# Patient Record
Sex: Female | Born: 1965 | Race: White | Hispanic: No | Marital: Married
Health system: Midwestern US, Academic
[De-identification: ages and names within clinical notes are randomized; demographics above are authoritative.]

---

## 2017-06-04 IMAGING — MG TOMO IMPLANT
11 of 17 series · 11 of 17 positions shown · non-contrast
Comparison: none

[R CC (1 of 2)]
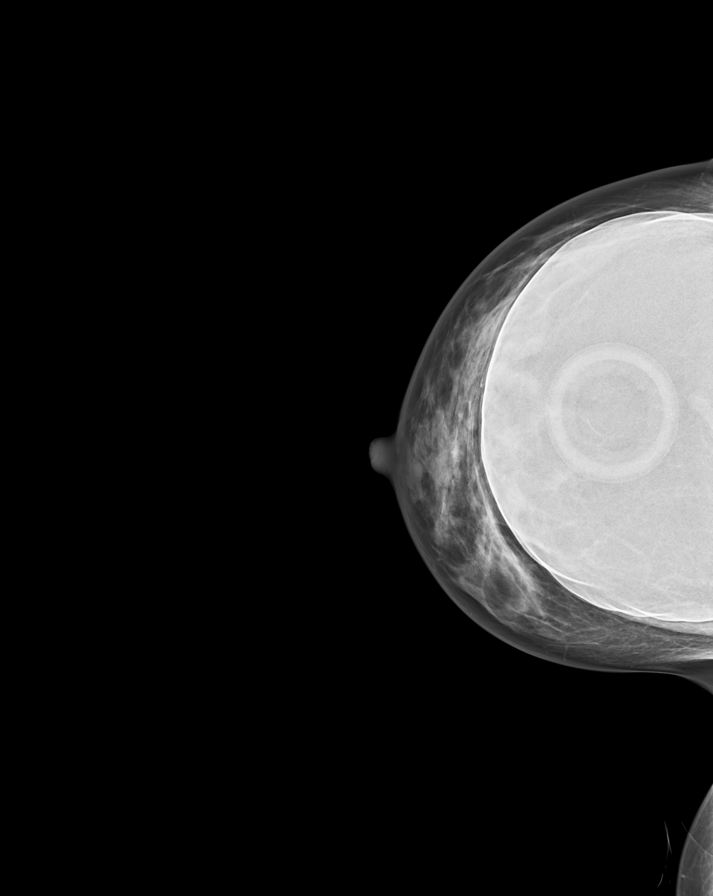

[L MLO (1 of 2)]
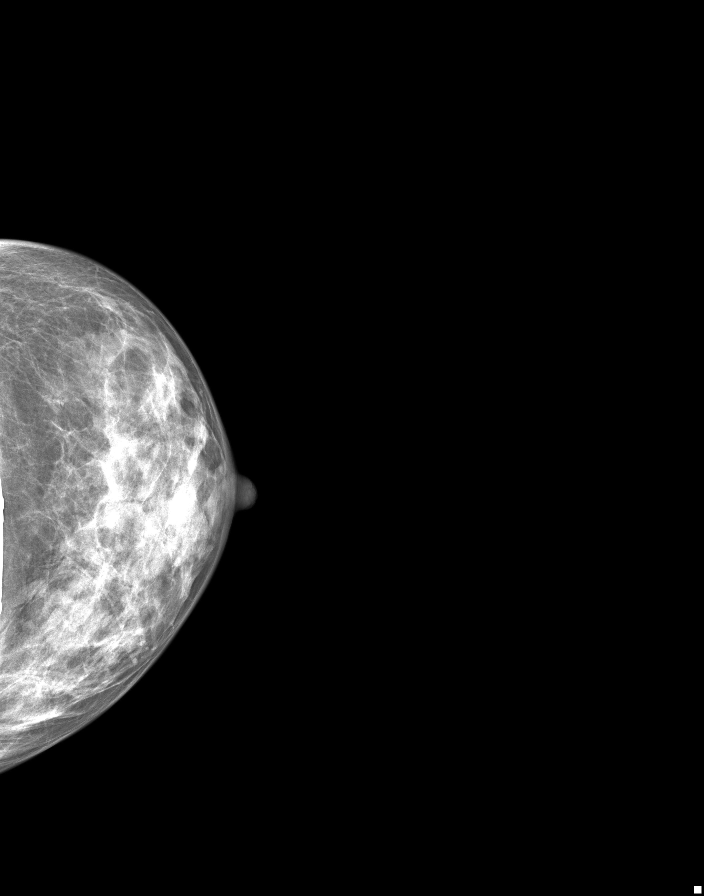

[L tomo]
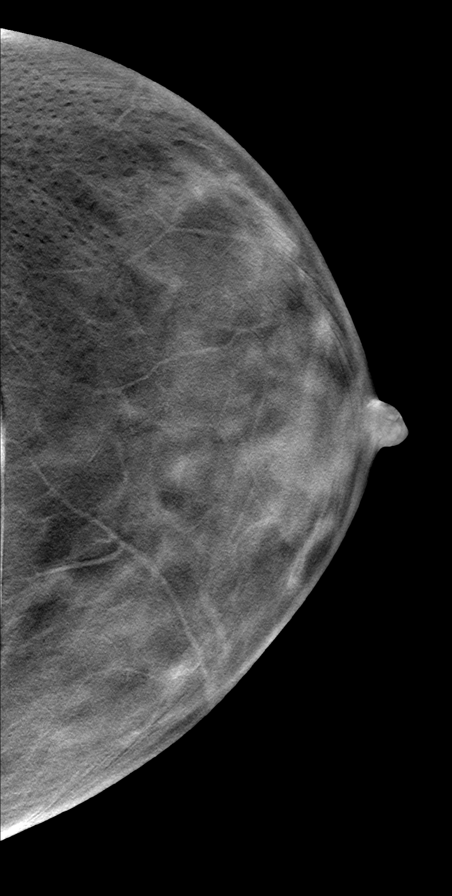

[L MLO (2 of 2)]
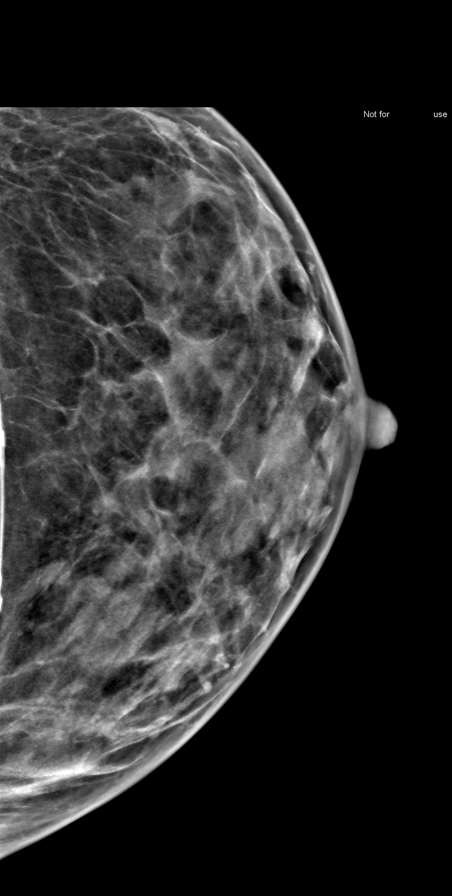

[L]
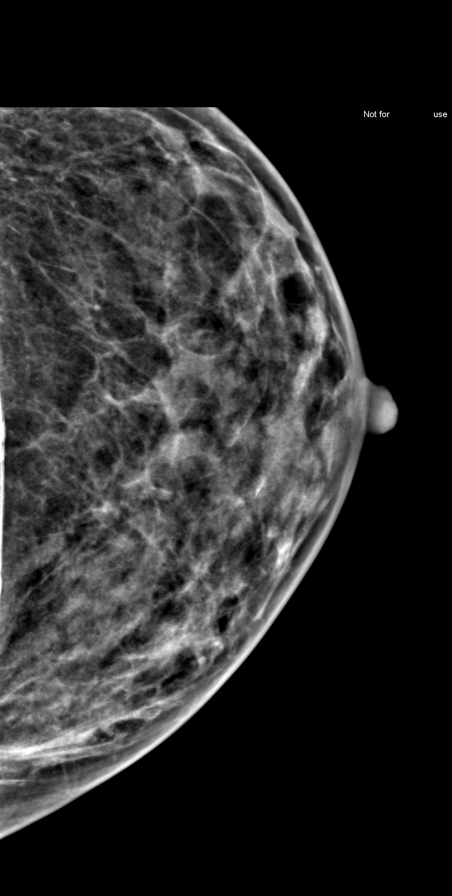

[R MLO (1 of 2)]
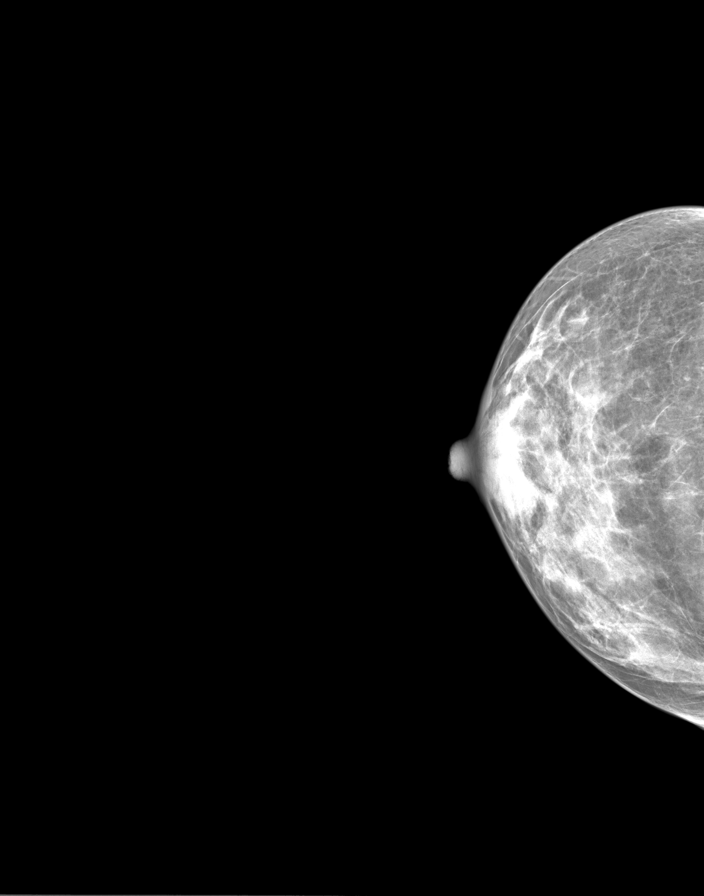

[R tomo (1 of 2)]
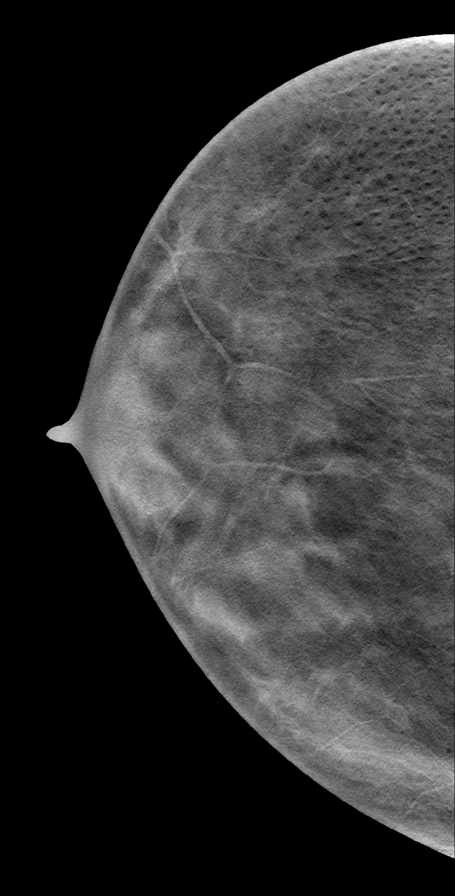

[R MLO (2 of 2)]
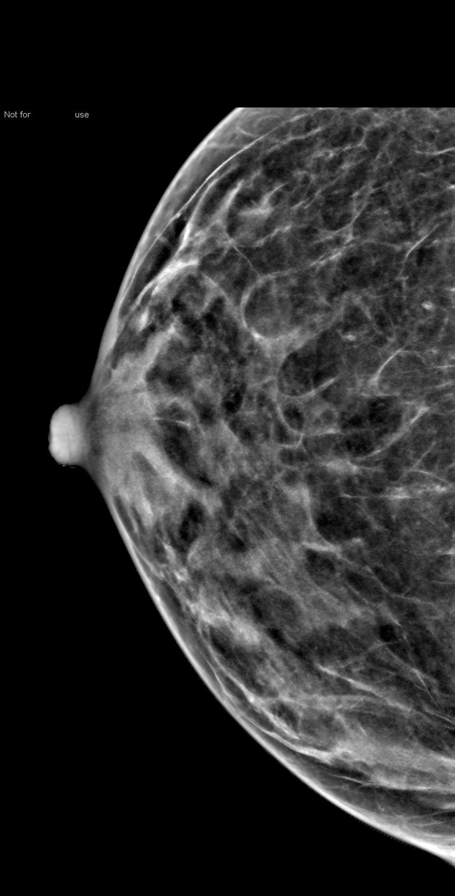

[R]
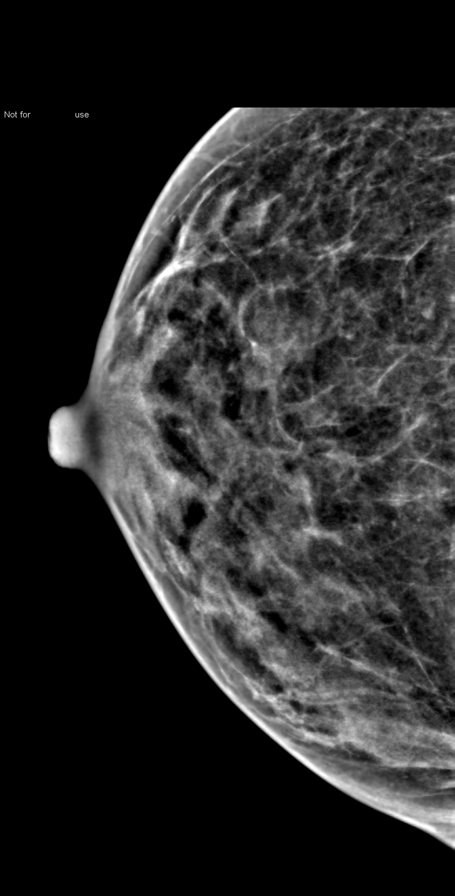

[R CC (2 of 2)]
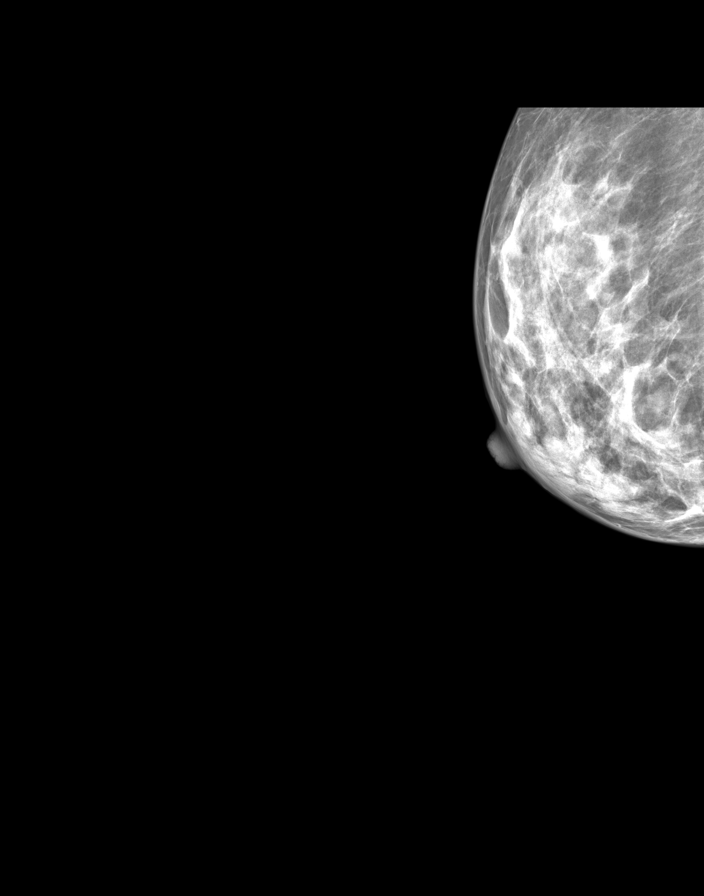

[R tomo (2 of 2)]
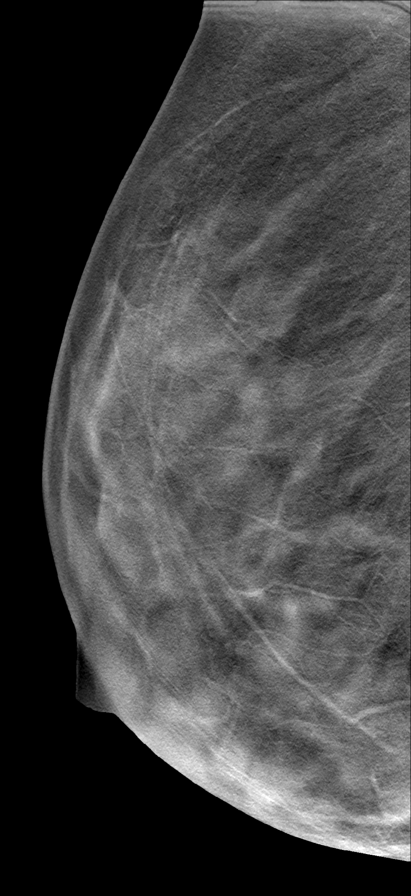

[11 of 17 positions shown; findings below may reference images not displayed]

EXAM

BILATERAL DIGITAL SCREENING MAMMOGRAM, CPT [id number]; WITH CAD

INDICATION

SCREENING
SCREENING. 3D IMPLANTS. LM

TECHNIQUE

Digital 2D CC and MLO projections obtained with 3D tomographic views per manufacturer's protocol.
ICAD version 7.2 was used during this exam.

COMPARISONS

Previous examinations dated 04/12/2016 and 03/24/2014.

FINDINGS

Bilateral retropectoral saline implants are identified in place. The breast parenchyma is
heterogeneously dense. There are no suspicious micro calcifications or suspicious spiculated
masses. There is no evidence of axillary adenopathy. There is no evidence of skin thickening or
nipple retraction.

IMPRESSION

No localizing signs of malignancy. Bilateral retropectoral saline implants are identified.

BI-RADS 2, BENIGN.

A reminder letter will be sent.

## 2017-11-18 IMAGING — CR UP_EXM
3 series · 3 of 3 positions shown · non-contrast
Comparison: none

[wrist pa]
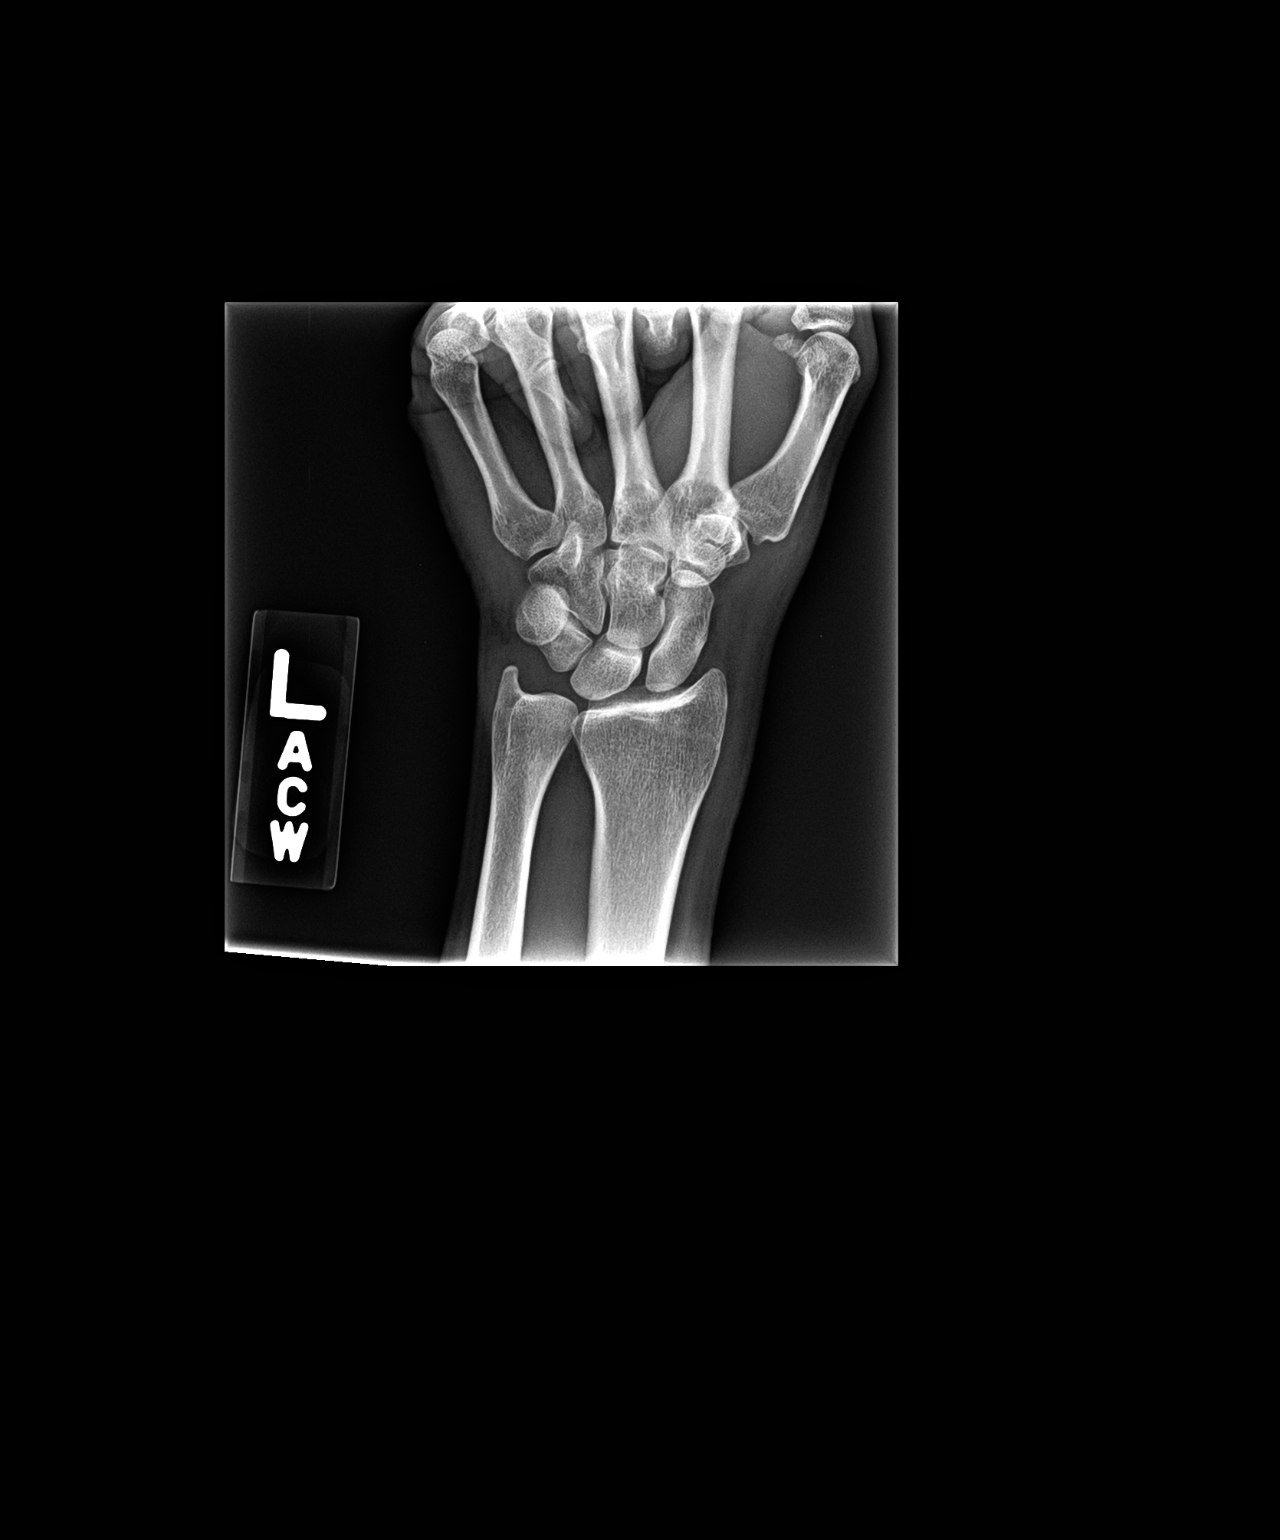

[wrist obl]
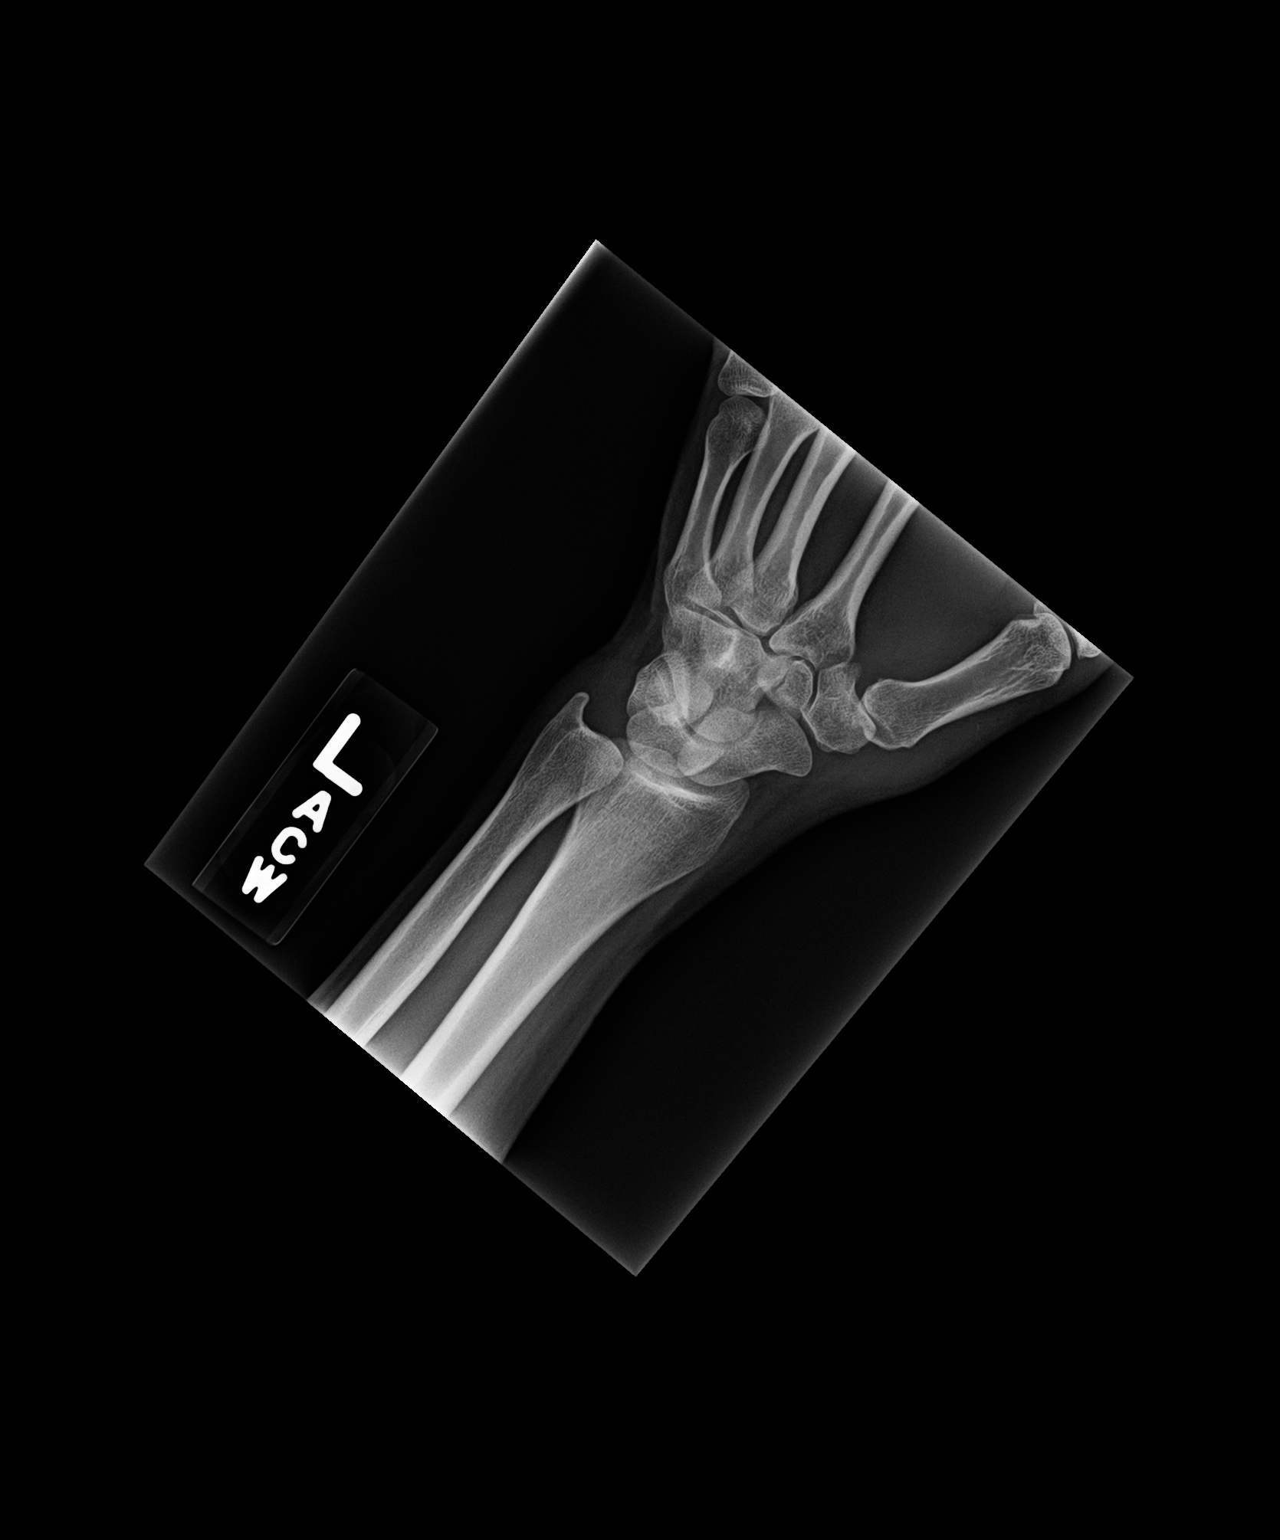

[wrist lat]
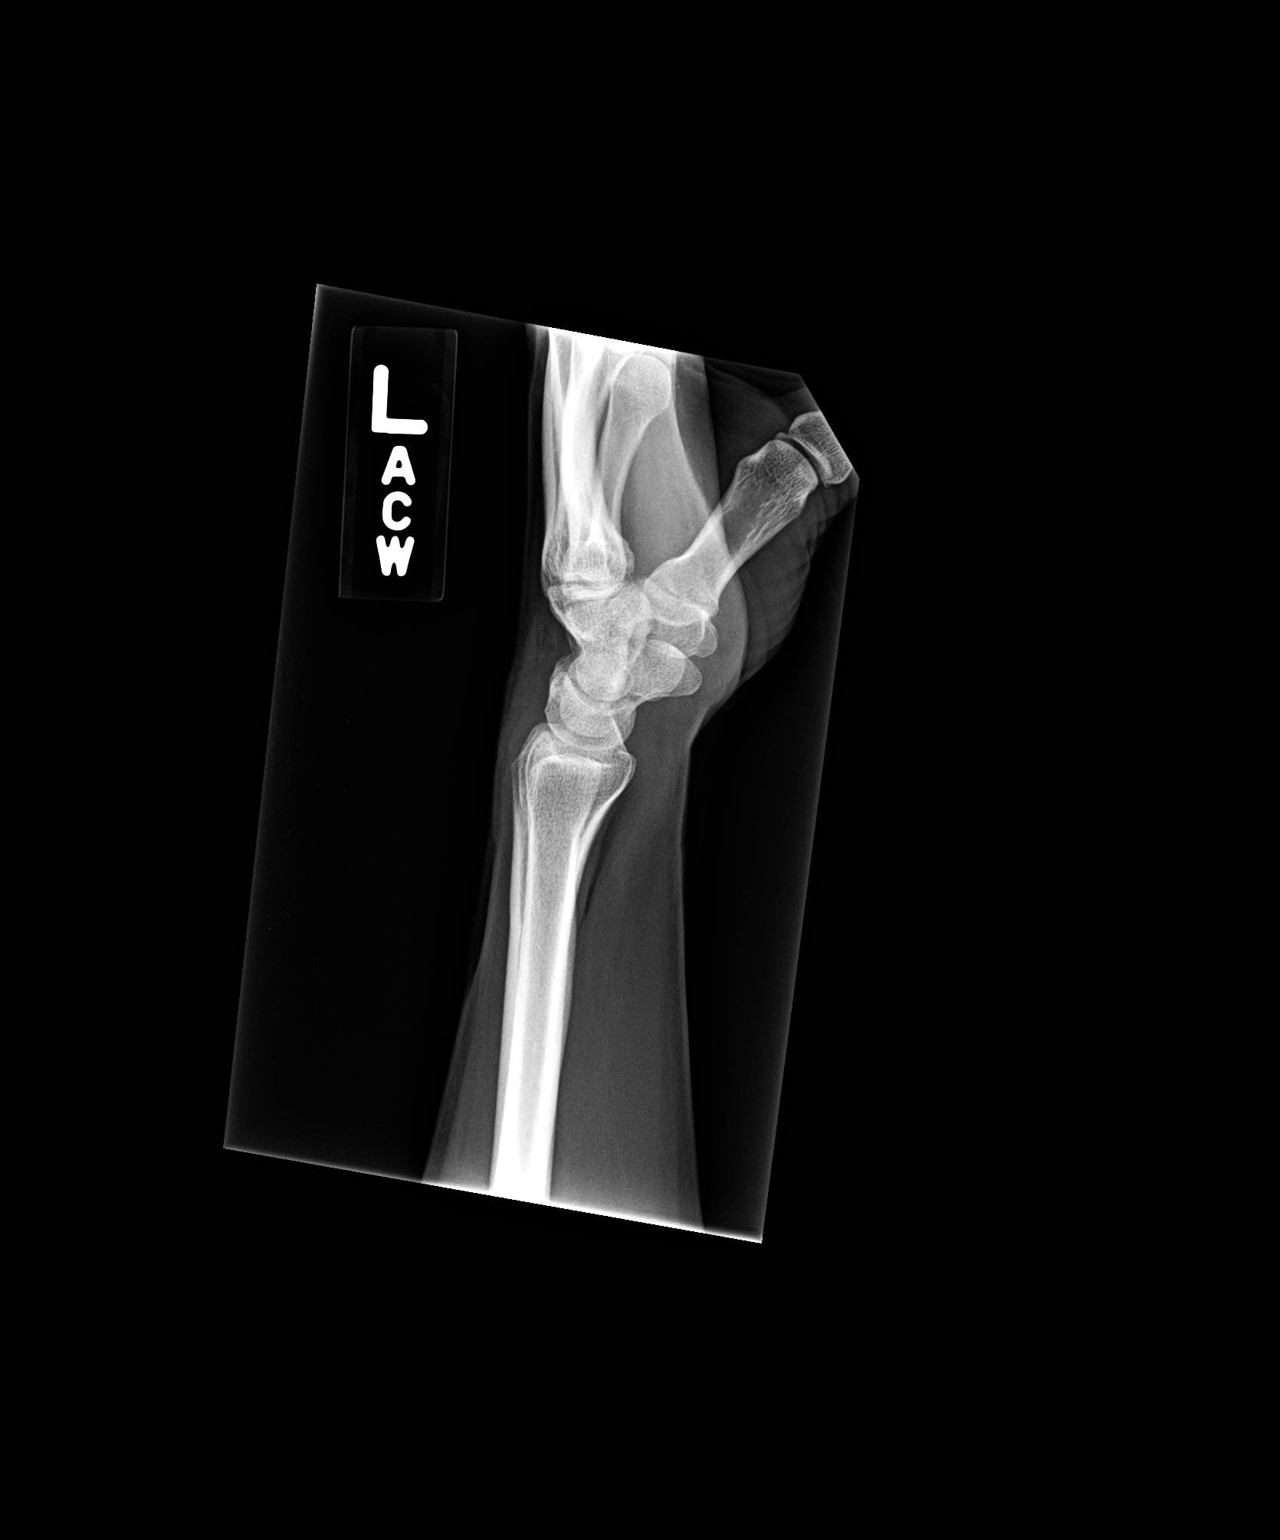

[3 of 3 positions shown; findings below may reference images not displayed]

DIAGNOSTIC STUDIES

EXAM
Left wrist radiographs.

INDICATION
Left wrist pain
Pt fell x 1 week. Pain in Lt 1st digit metacarpal. Denies pregnancy. AW

TECHNIQUE
PA, oblique, and lateral views of the left wrist.

COMPARISONS
None.

FINDINGS
The alignment is anatomic without displaced fracture or dislocation. Joint spaces are intact. No
aggressive osseous lesion.

IMPRESSION
Negative left wrist series.

Tech Notes:

Pt fell x 1 week. Pain in Lt 1st digit metacarpal. Denies pregnancy. AW

## 2020-03-16 IMAGING — MG TOMO IMPLANT
12 of 17 series · 12 of 17 positions shown · non-contrast
Comparison: none

[R CC (1 of 3)]
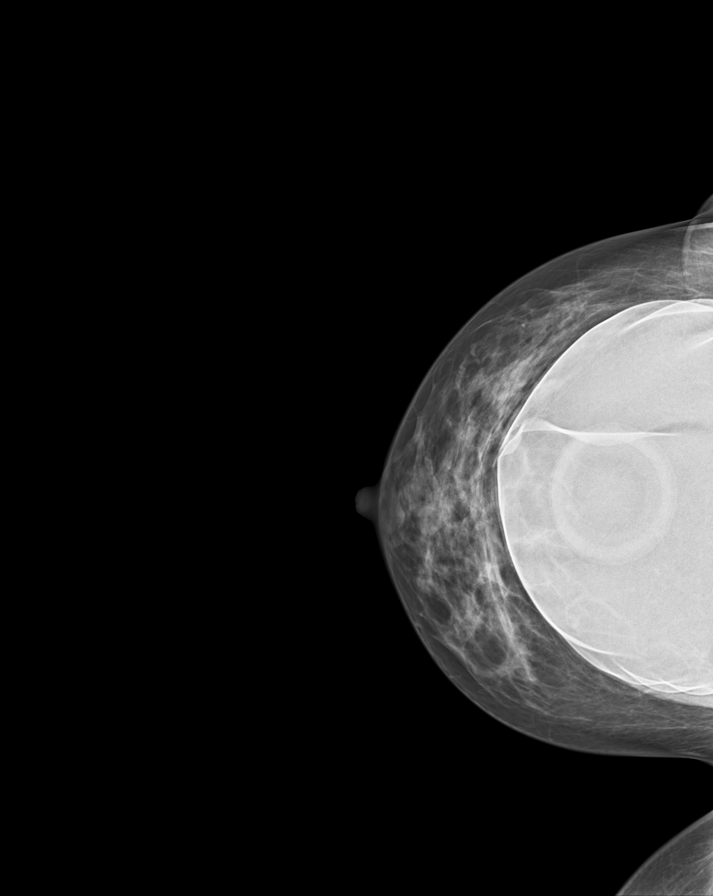

[L MLO (1 of 2)]
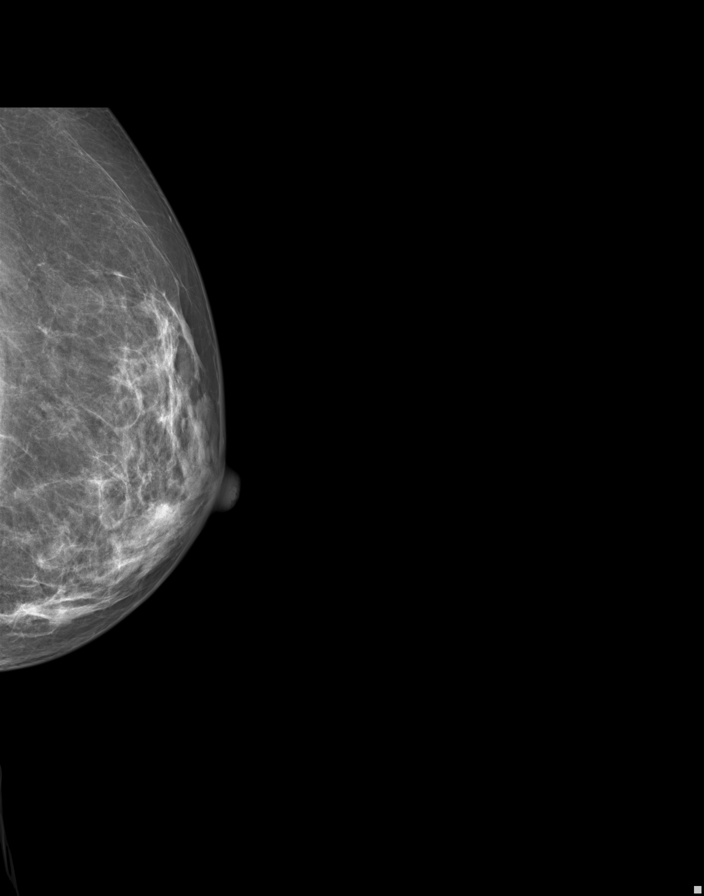

[L tomo]
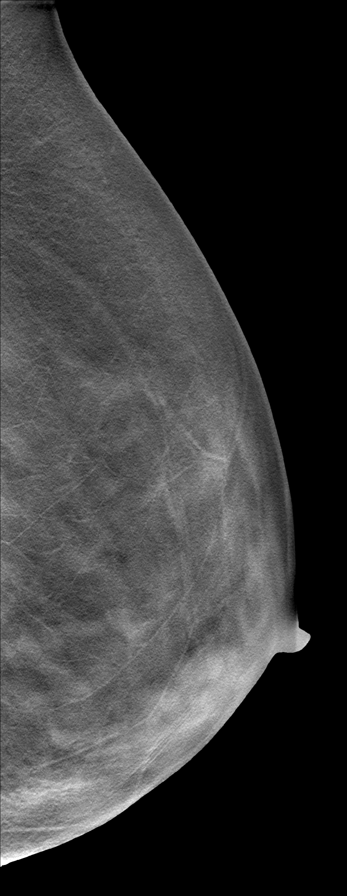

[L MLO (2 of 2)]
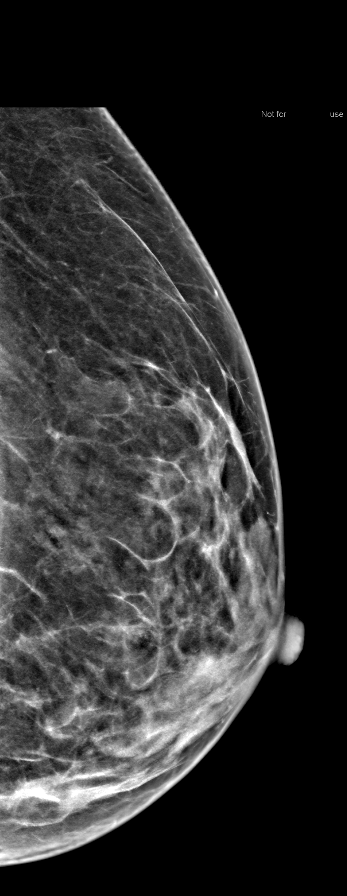

[L]
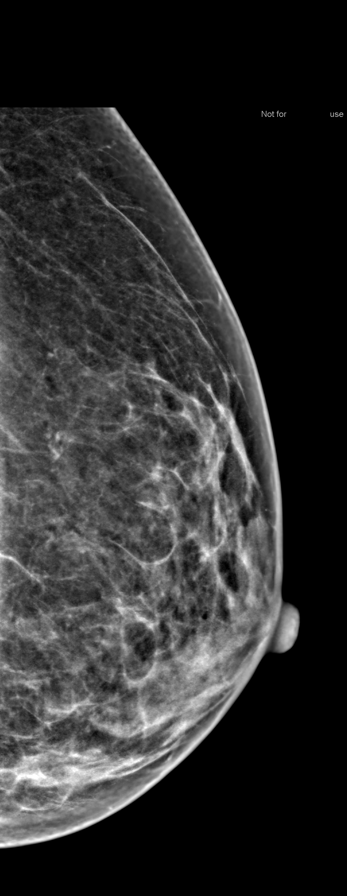

[R MLO (1 of 2)]
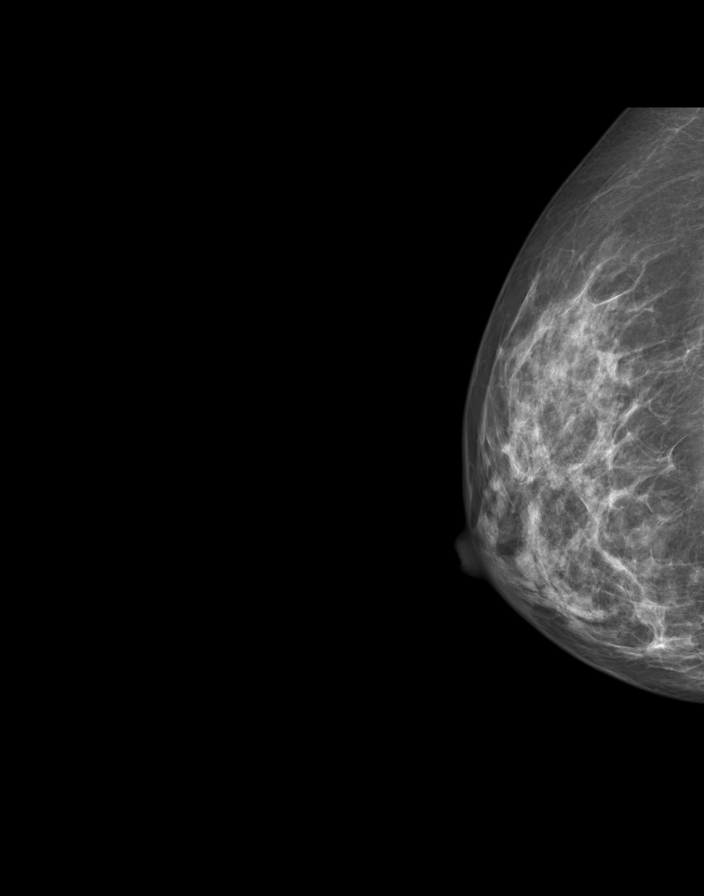

[R tomo (1 of 2)]
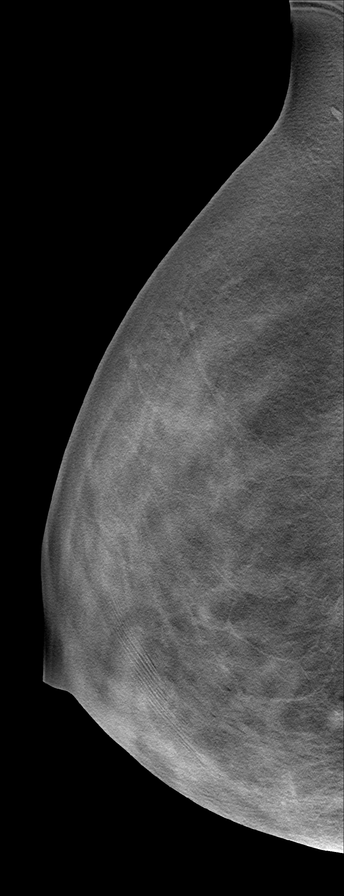

[R MLO (2 of 2)]
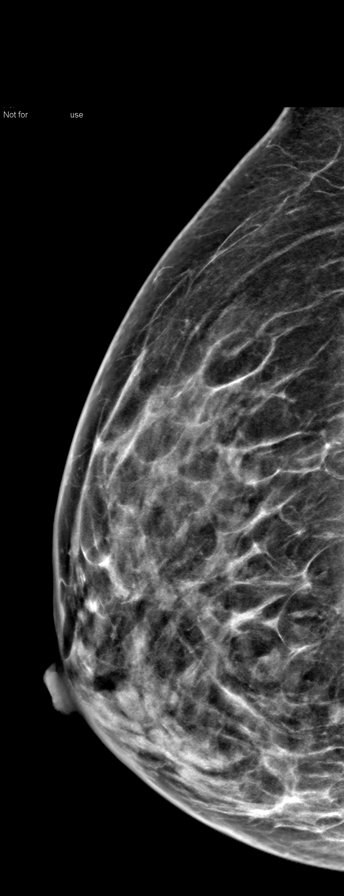

[R]
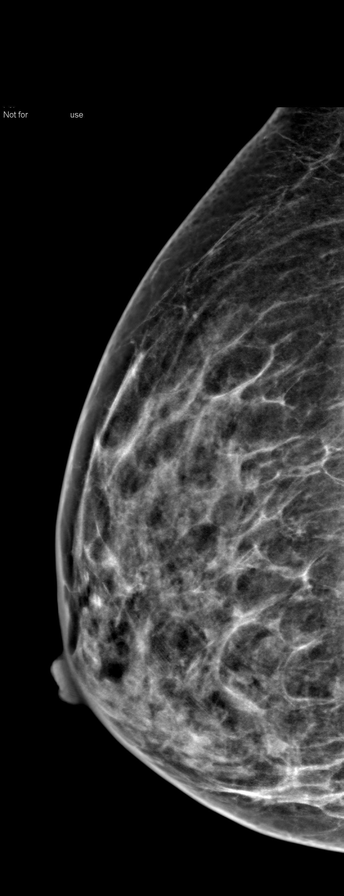

[R CC (2 of 3)]
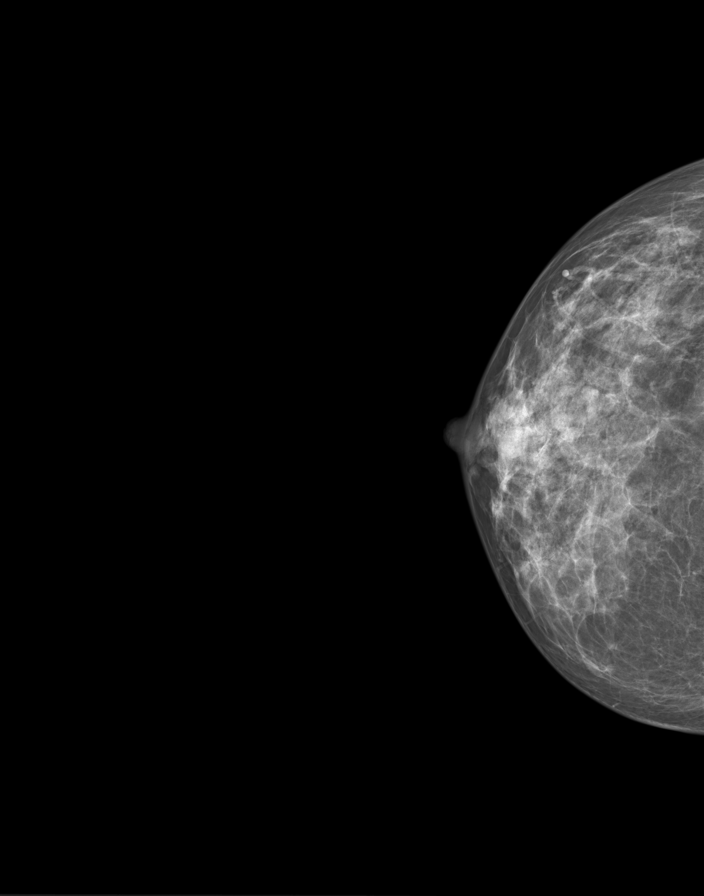

[R tomo (2 of 2)]
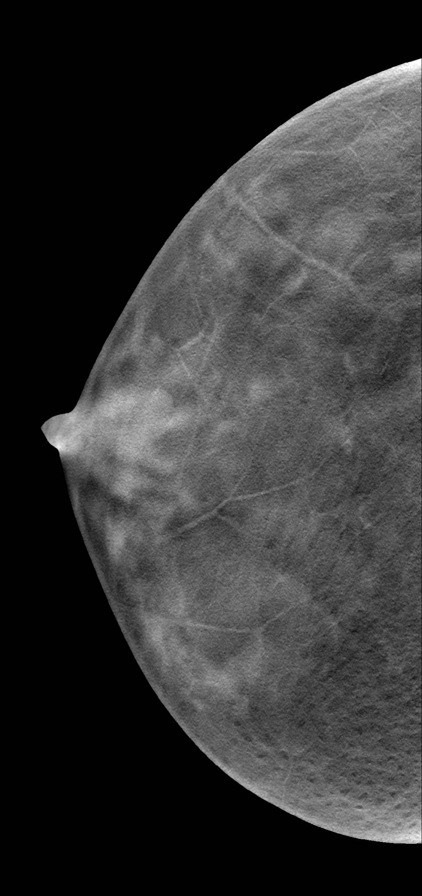

[R CC (3 of 3)]
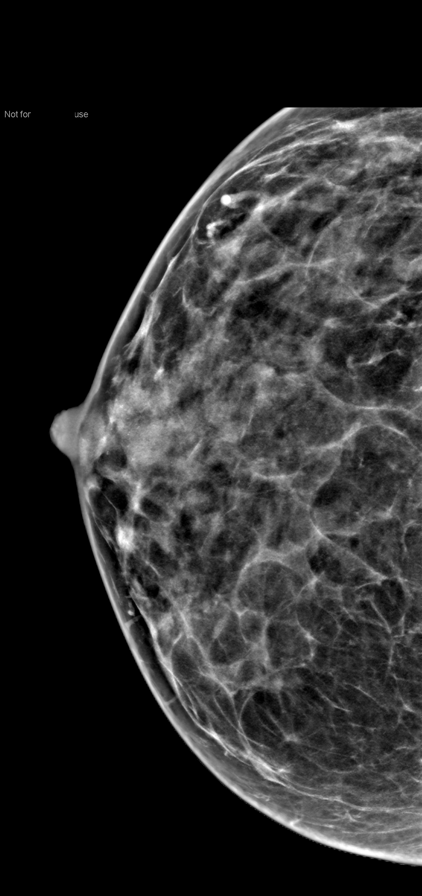

[12 of 17 positions shown; findings below may reference images not displayed]

EXAM

SCREENING MAMMOGRAM, BILATERAL

INDICATION

yearly screeening
0119 SALINE IMPLANTS, BEHIND THE MUSCLE.  BIOIDENTICAL HRT X 1 YR.  SCREENING.  AB (3D) PRIORS:

TECHNIQUE

Bilateral craniocaudal and medial lateral oblique views were generated and reviewed with computer-
aided detection.

COMPARISONS

July 08, 2018,June 04, 2017

FINDINGS

Heterogeneous breast parenchyma which can obscure small masses. Bilateral retropectoral saline
implants. Circumscribed mass in the right breast posterior depth approximately 7.5 cm deep to the
nipple, corresponding to the [DATE] position based off tomosynthesis. This is not definitively seen on
prior studies, though likely represents a cyst.

IMPRESSION

Recommend ultrasound of the right breast.

BI-RADS 0- Incomplete: Additional imaging evaluation needed.

Tech Notes:

## 2020-08-02 IMAGING — CR WRISTCMRT
3 series · 3 of 3 positions shown · non-contrast
Comparison: none

[wrist pa]
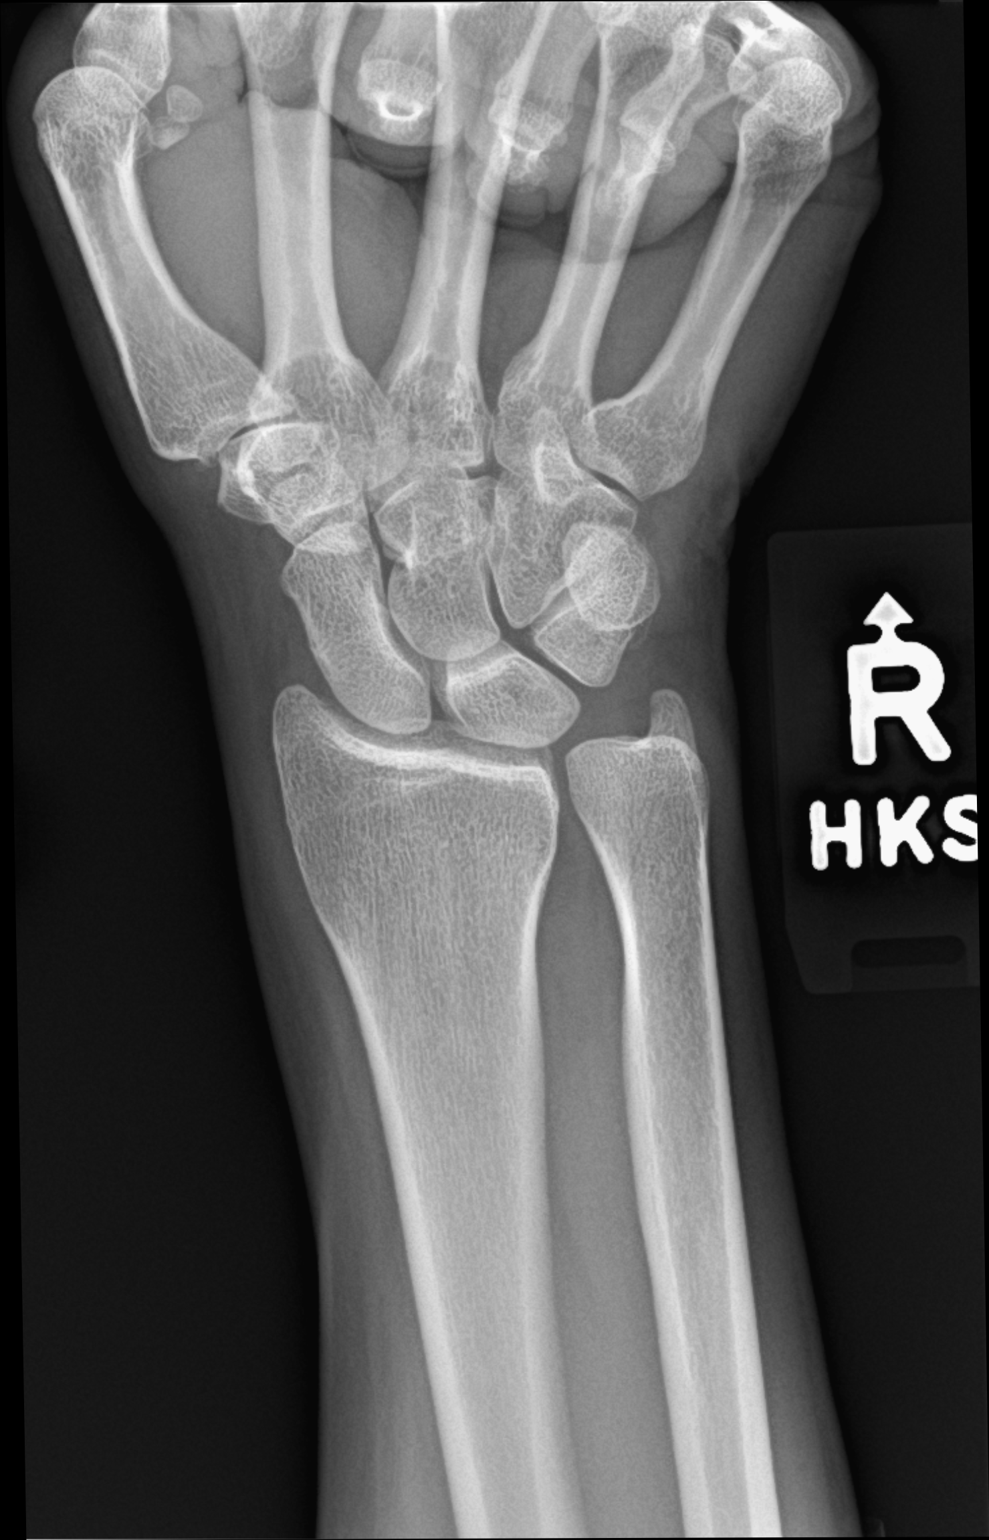

[wrist obl]
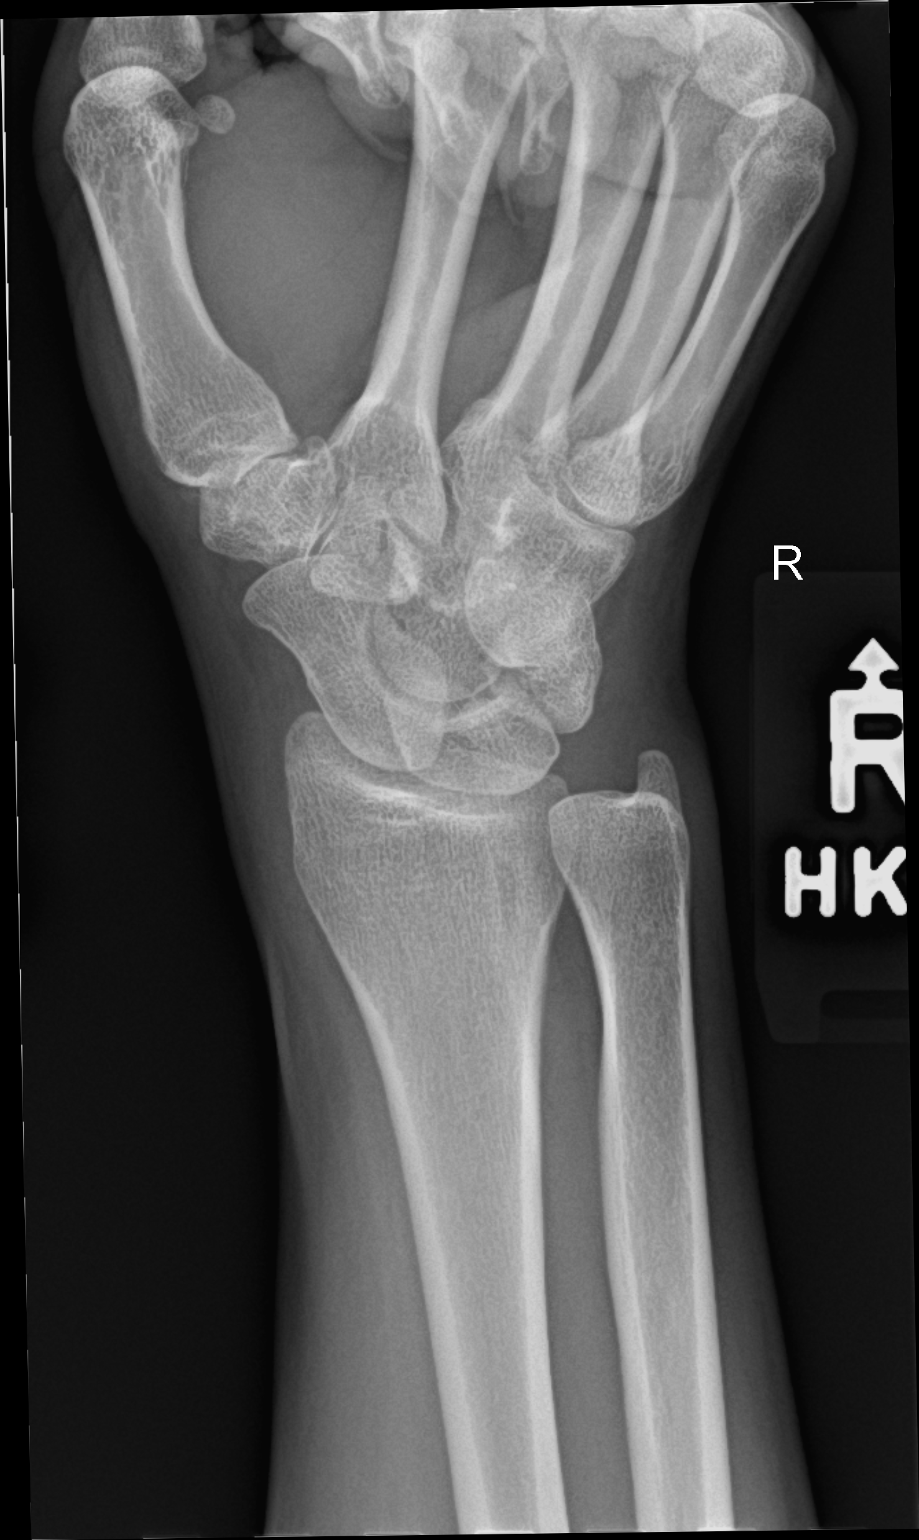

[wrist lat]
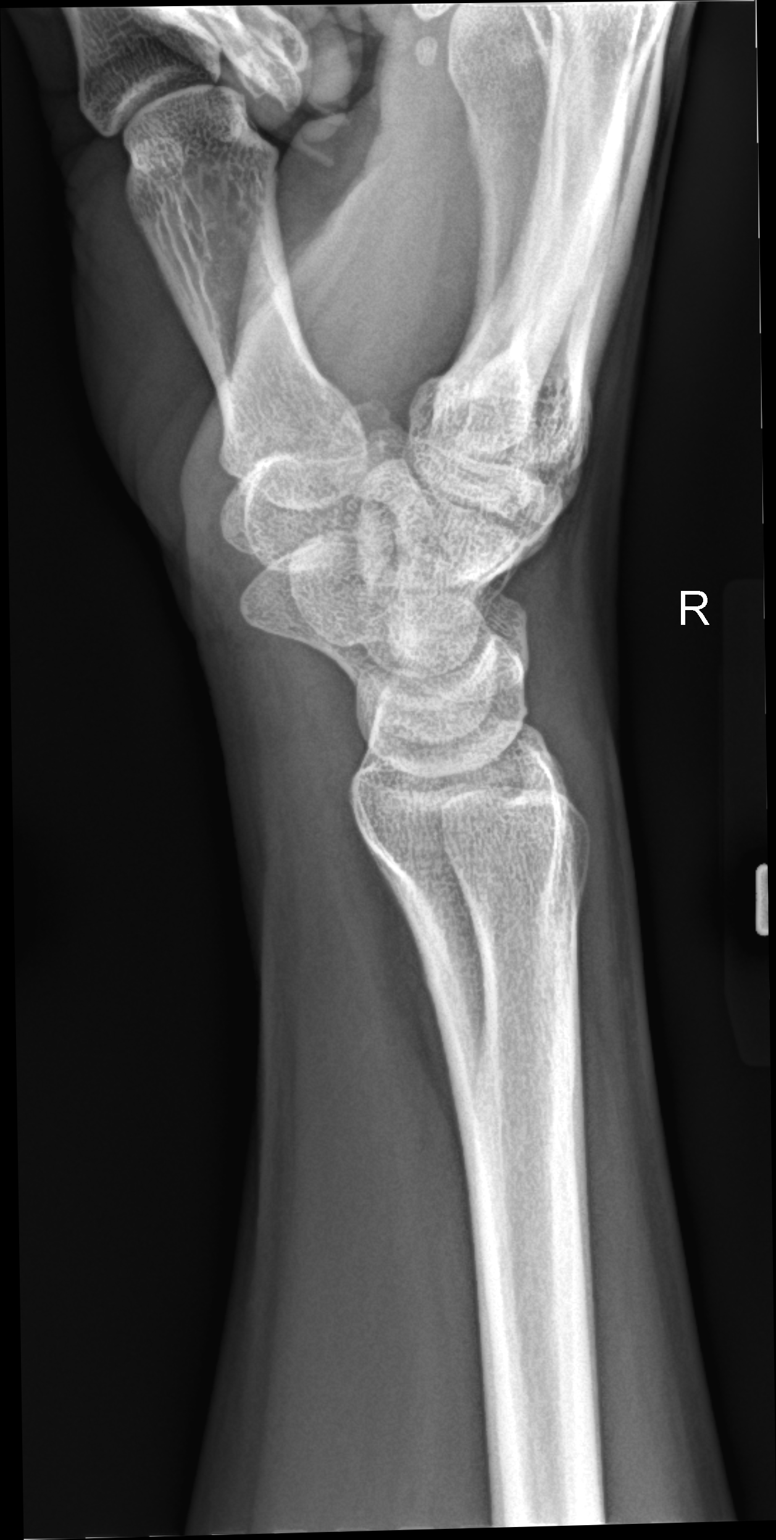

[3 of 3 positions shown; findings below may reference images not displayed]

DIAGNOSTIC STUDIES

EXAM

XR wrist RT min 3V

INDICATION

pain
Right wrist pain with no known injury.

TECHNIQUE

AP lateral and oblique views right wrist.

COMPARISONS

None available

FINDINGS

No fractures or dislocations are seen. Joint spaces are well maintained.

IMPRESSION

No fractures or dislocations of the right wrist.

Tech Notes:

Right wrist pain with no known injury.

## 2020-08-10 IMAGING — MR Wrist^ROUTINE
7 of 11 series · 23 of 40 positions shown · non-contrast
Comparison: none

[Series 5: T1 · coronal · 3.0mm · 0.26mm/px · 4 of 10 slices shown (1 of 2)]
[im 1/10]
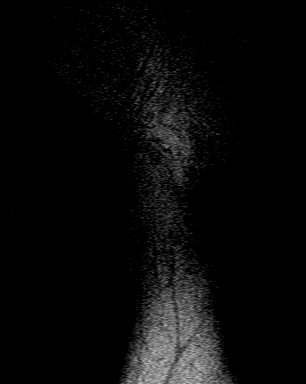
[im 4/10]
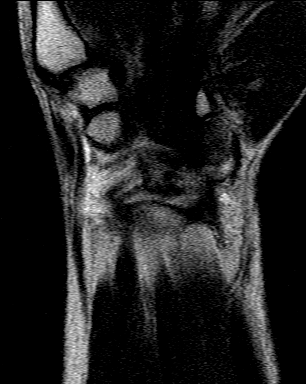
[im 7/10]
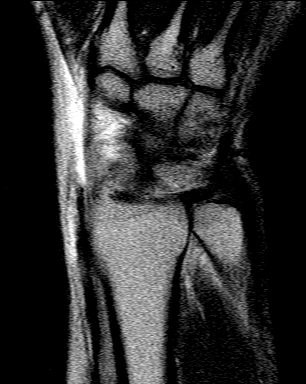
[im 10/10]
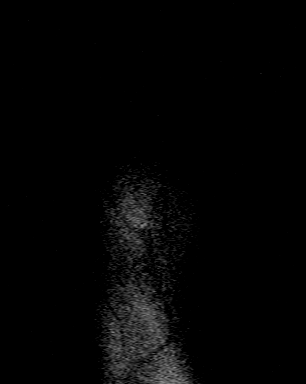

[Series 7: T2 fat-sat · coronal · 3.0mm · 0.20mm/px · 1 of 4 slices shown (1 of 3)]
[im 1/4]
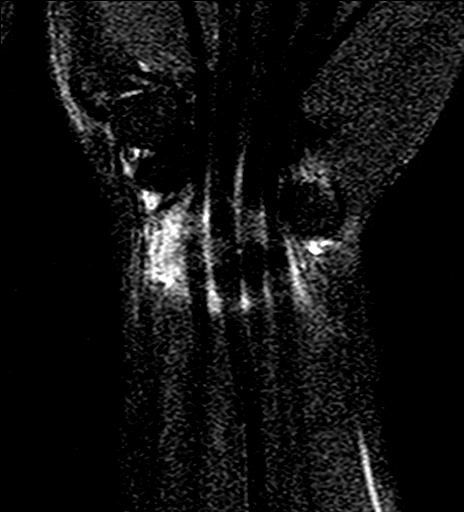

[Series 8: T1 · sagittal · 3.0mm · 0.26mm/px · 2 of 5 slices shown (2 of 2)]
[im 1/5]
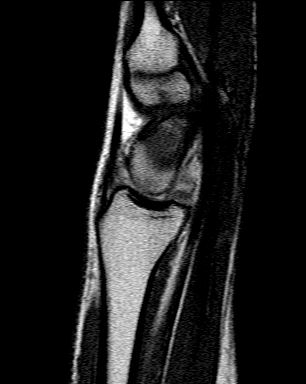
[im 5/5]
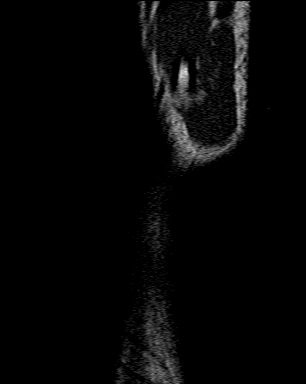

[Series 9: T2 fat-sat · sagittal · 3.0mm · 0.20mm/px · 3 of 8 slices shown (2 of 3)]
[im 1/8]
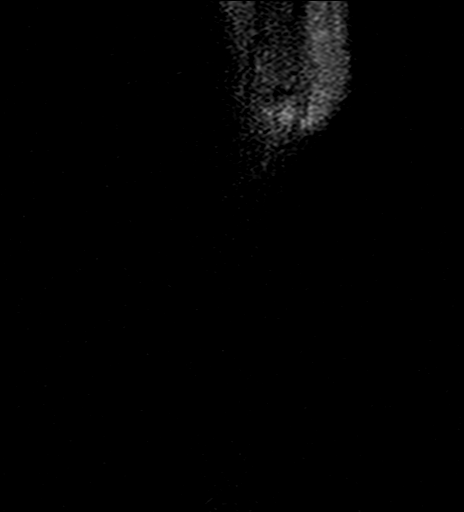
[im 4/8]
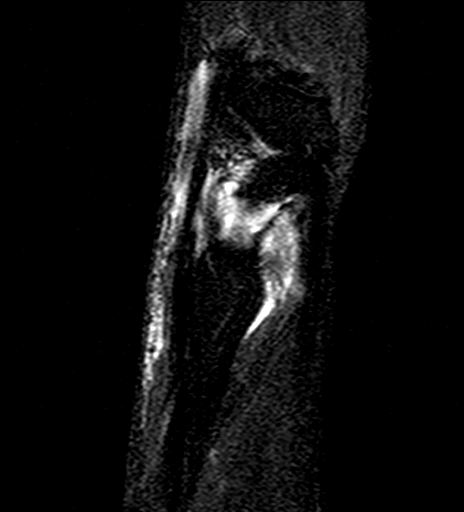
[im 8/8]
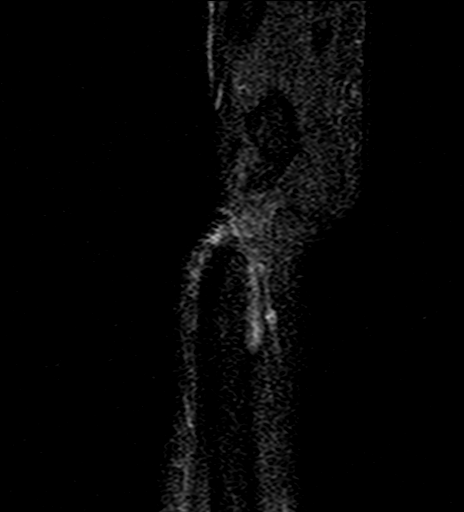

[Series 10: T2 fat-sat · axial · 3.0mm · 0.18mm/px · z∈[-2,+56]mm · 6 of 16 slices shown (3 of 3)]
[im 1/16]
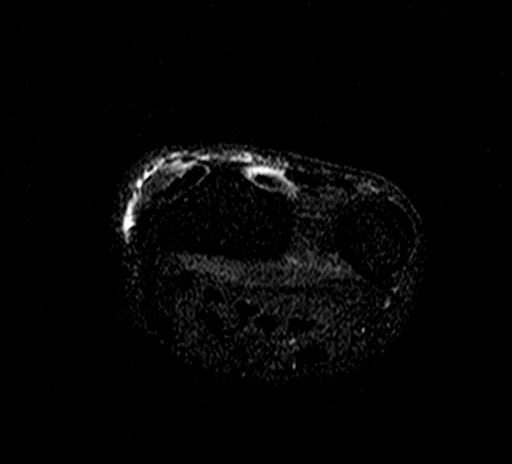
[im 4/16]
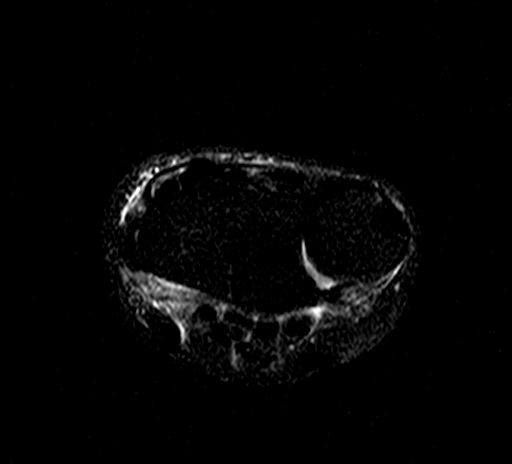
[im 7/16]
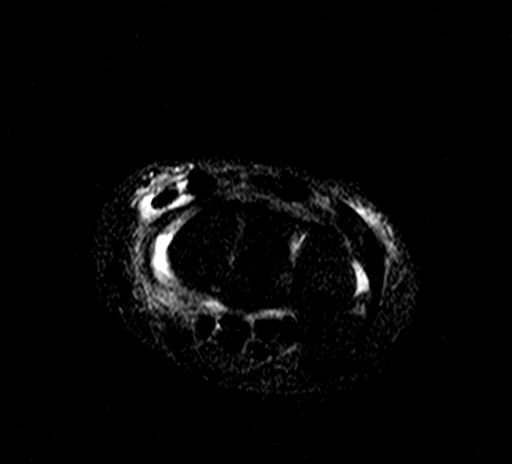
[im 10/16]
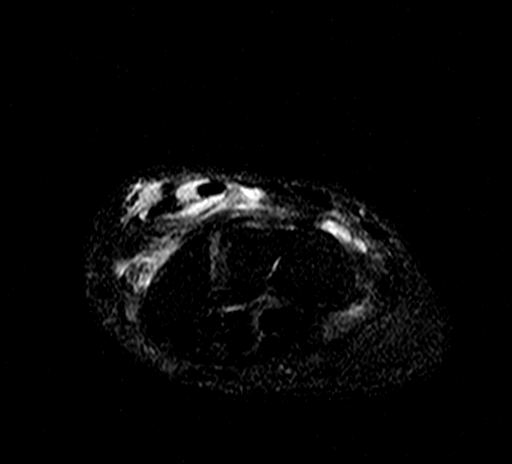
[im 13/16]
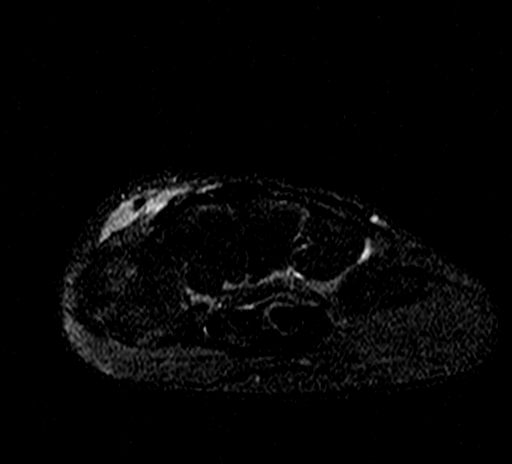
[im 16/16]
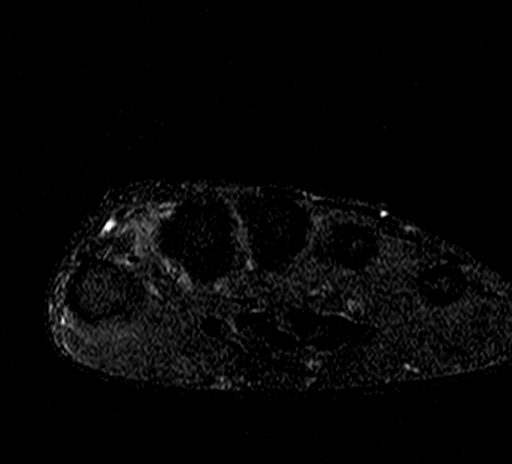

[Series 11: PD fat-sat · axial · 3.0mm · 0.20mm/px · z∈[-6,+55]mm · 5 of 14 slices shown]
[im 1/14]
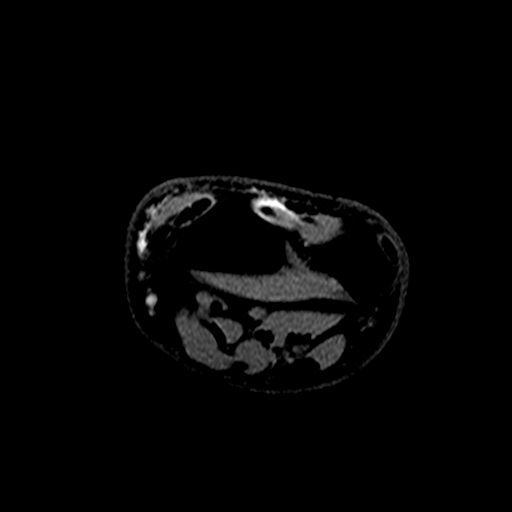
[im 4/14]
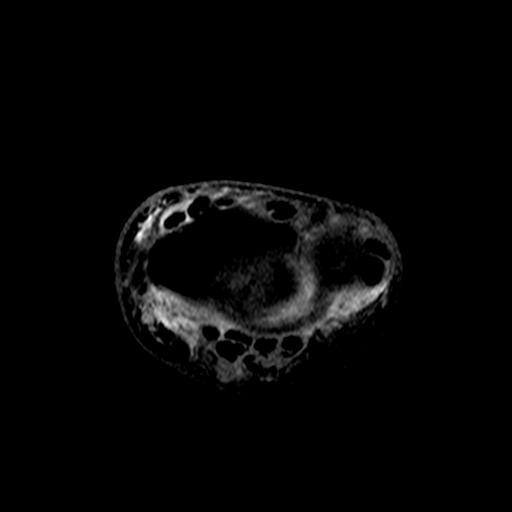
[im 7/14]
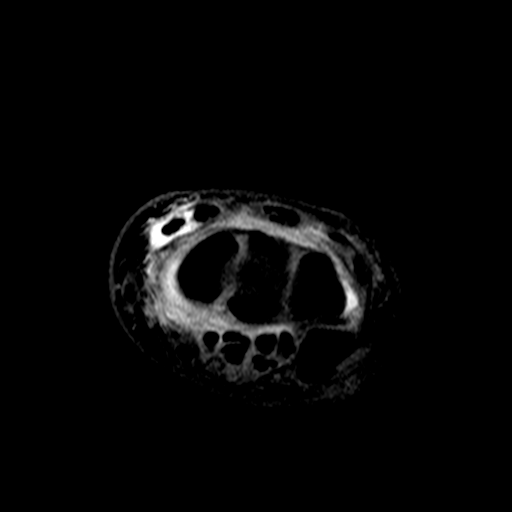
[im 10/14]
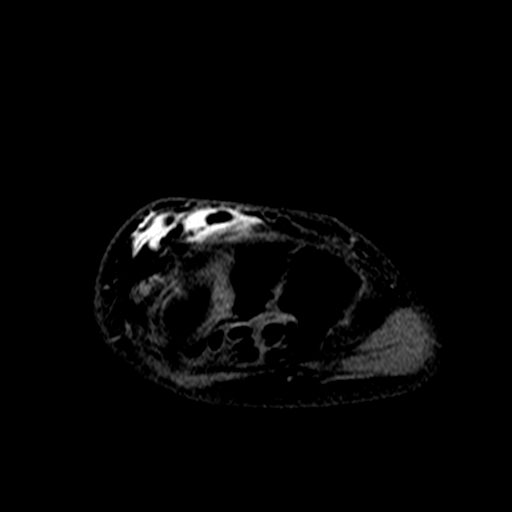
[im 14/14]
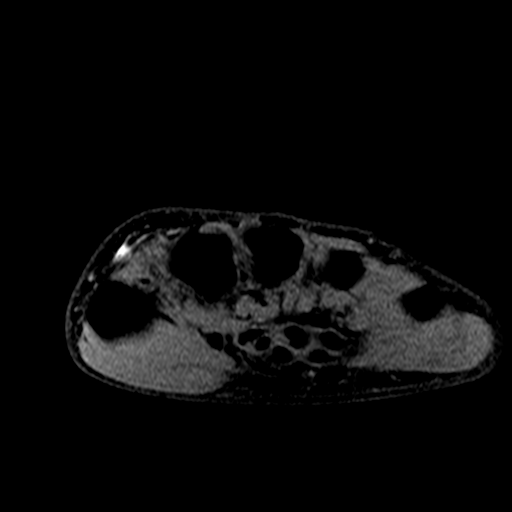

[Series 12: T1 fat-sat · axial · 3.0mm · 0.23mm/px · z∈[-14,+2]mm · 2 of 14 slices shown]
[im 1/14]
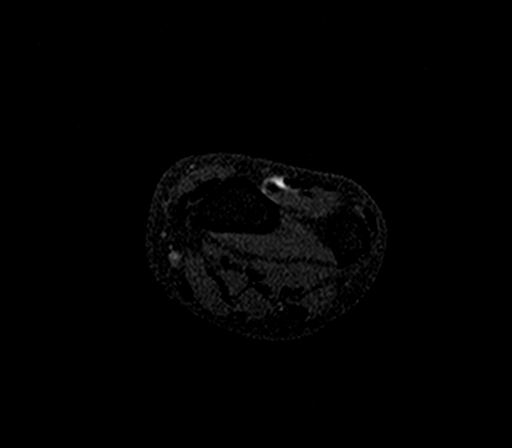
[im 4/14]
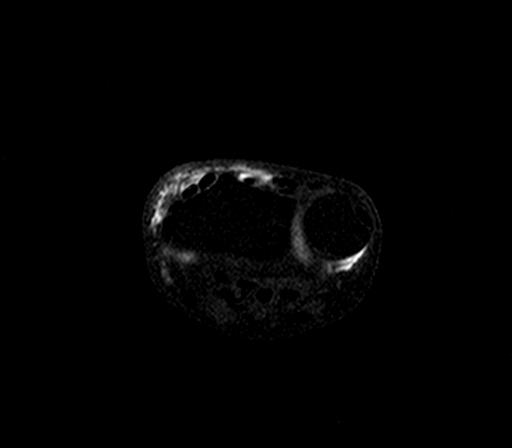

[23 of 40 positions shown; findings below may reference images not displayed]

EXAM

MR OF THE WRIST

INDICATION

Extension pain, focal area of pain projecting over the level of proximal pole of the capitate,
golfer

TECHNIQUE

Multiplanar, multisequence imaging of the wrist was performed. Intra-articular injection was
performed

COMPARISONS

XR right wrist 08/02/2020

FINDINGS

CARPAL TUNNEL: The traversing flexor tendons are intact. Normal thickness of the overlying flexor
retinaculum. Normal signal intensity and morphology of the median nerve.

FLEXOR TENDONS: Intact without tenosynovitis.

EXTENSOR TENDONS: Incidental accessory tendon slip suspected of the extensor pollicis brevis with 3
tendons in the 1st extensor compartment (series 11, image 6). Iatrogenic injection of contrast/fluid
into the 2nd and 3rd extensor compartments, which limits evaluation for distal intersection
syndrome.

TRIANGULAR FIBROCARTILAGE COMPLEX: Extension of contrast through a small perforation of the
articular disc of the triangular fibrocartilage complex (series 7, image 6). Poor anatomic
delineation of the additional TFCC components

EXTRINSIC/INTRINSIC LIGAMENTS: No definitive contrast extension into the midcarpal row. No distinct
defect of the scapholunate or lunotriquetral ligaments. Suspected iatrogenic injection related dis
tention of the volar recess of the wrist joint causing edematous appearance of the radioscaphoid
capitate volar extrinsic ligament (series 7, image 8).

JOINTS: No visualized intra-articular body.

BONE: Normal.

MUSCLES: Normal.

Neurovascular: Normal signal intensity and morphology of the ulnar nerve at Guyon's canal. The
radial neurovascular bundle is intact.

OTHER: The surrounding soft tissues are unremarkable.

IMPRESSION
1. Iatrogenic injection of contrast into the 2nd and 3rd extensor compartment which limits
evaluation for distal intersection syndrome. If there is continued clinical concern, recommend
follow-up axial fluid sensitive MR sequence imaging versus ultrasound targeted to the area of pain
of the extensor tendons.
2. Intact scapholunate and lunotriquetral ligaments.
3. Perforation of the articular disc of the triangular fibrocartilage complex.

Tech Notes:

radial sided wrist pain since April 2020, continued worsening pain since [DATE]

## 2020-08-10 IMAGING — RF FL guided needle placement
1 series · 2 of 2 positions shown · non-contrast
Comparison: none

[Series 1: arthrogr 1 · 2 of 2 slices shown]
[im 1/2]
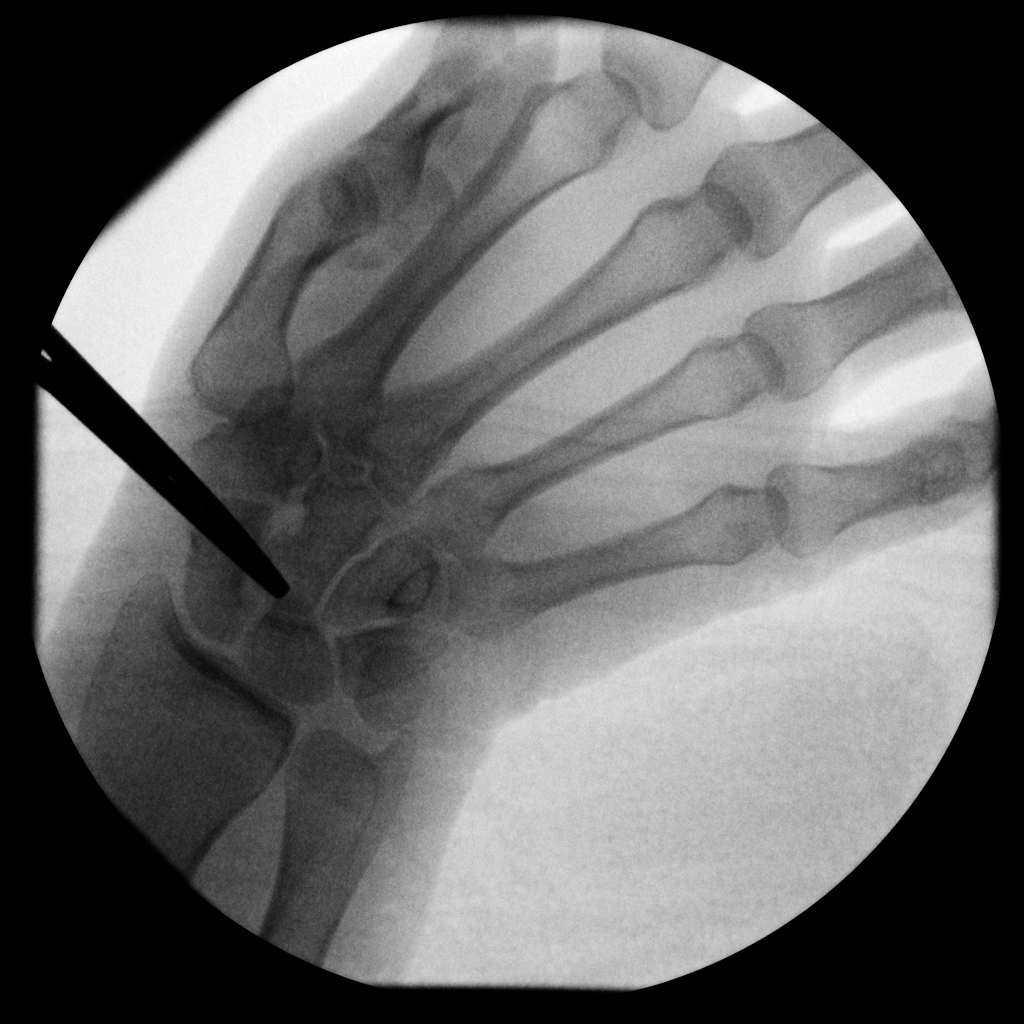
[im 2/2]
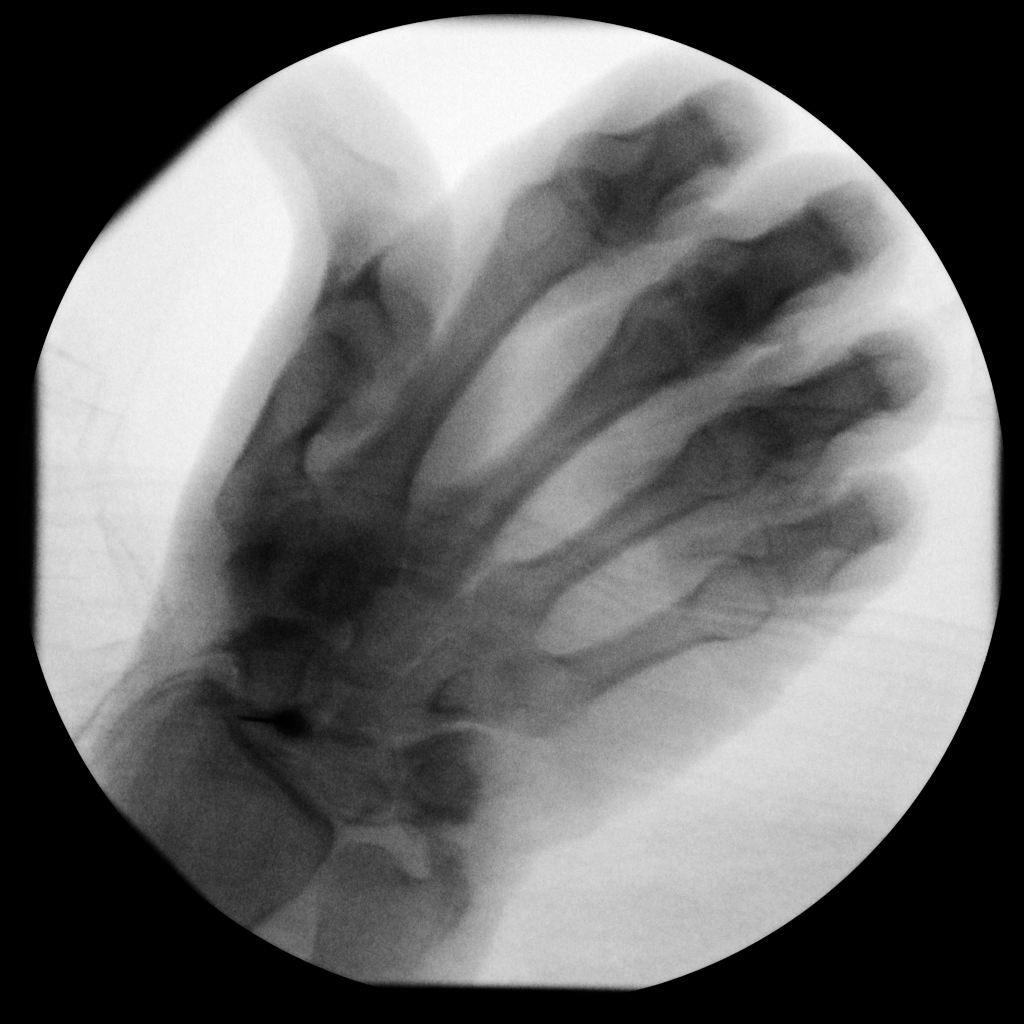

[2 of 2 positions shown; findings below may reference images not displayed]

EXAM

FL inj for wrist arthrogram

INDICATION

Wrist pain

TECHNIQUE

After informed consent, the area was sterilely prepped and draped. 1 percent lidocaine was
infiltrated overlying the skin. A 25 gauge [DATE]th inch needle was then inserted into the joint space.
Subsequently 1 milliliters of diluted gadolinium contrast solution was injected into the joint
space. The final needle was then removed. The area was then cleaned and bandaged. The patient was
then transferred to the MRI suite.

Fluoroscopic time: 6 seconds

COMPARISONS

XR right wrist 08/02/2020

FINDINGS

Indicated area of pain projects over the proximal pole of the capitate.

Initial injection overlying the 2nd extensor compartment. Subsequent repositioning demonstrated
partial injection of contrast into the radiocarpal joint.

IMPRESSION
1. Successful wrist arthrogram, however iatrogenic injection into the 2nd extensor compartment.

Tech Notes:

RIGHT WRIST ARTHROGRAM. 2 FLUORO IMAGES. FLUORO DOSE 0.33 mGy. FLUORO TIME 6 SECONDS. TJ

## 2021-11-03 IMAGING — CR [ID]
2 series · 2 of 2 positions shown · non-contrast
Comparison: none

[pelvis ap (1 of 2)]
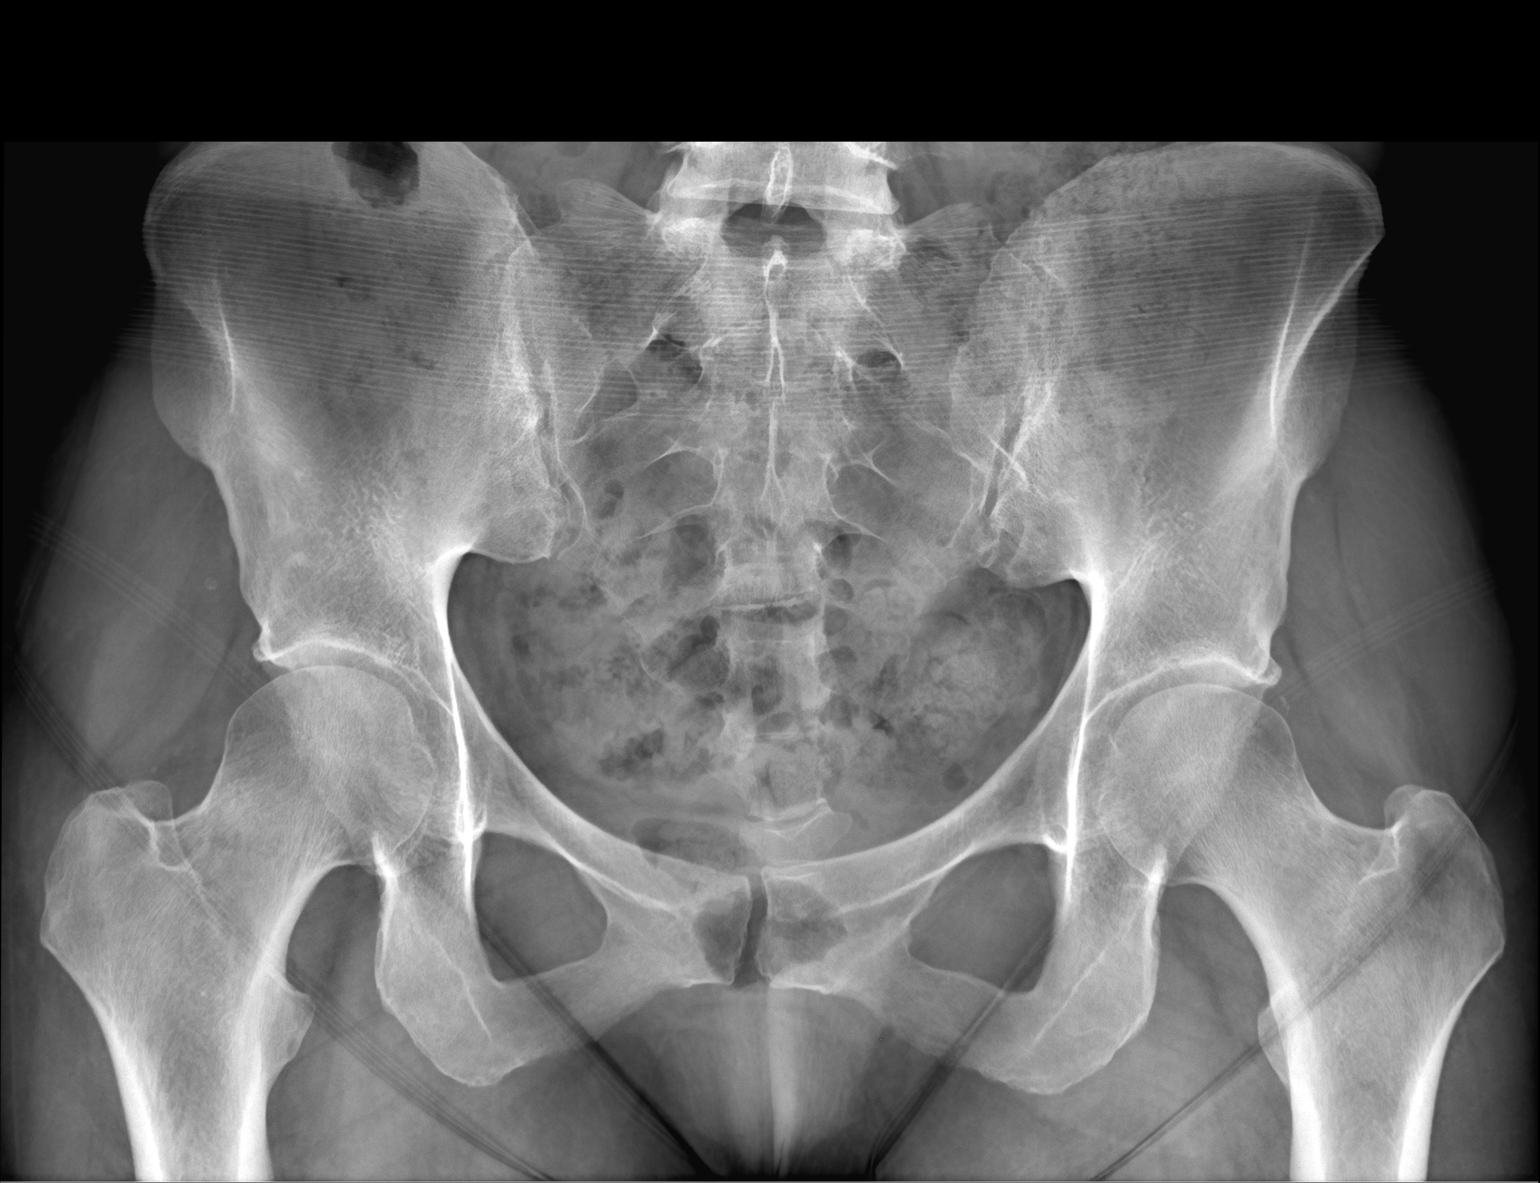

[pelvis ap (2 of 2)]
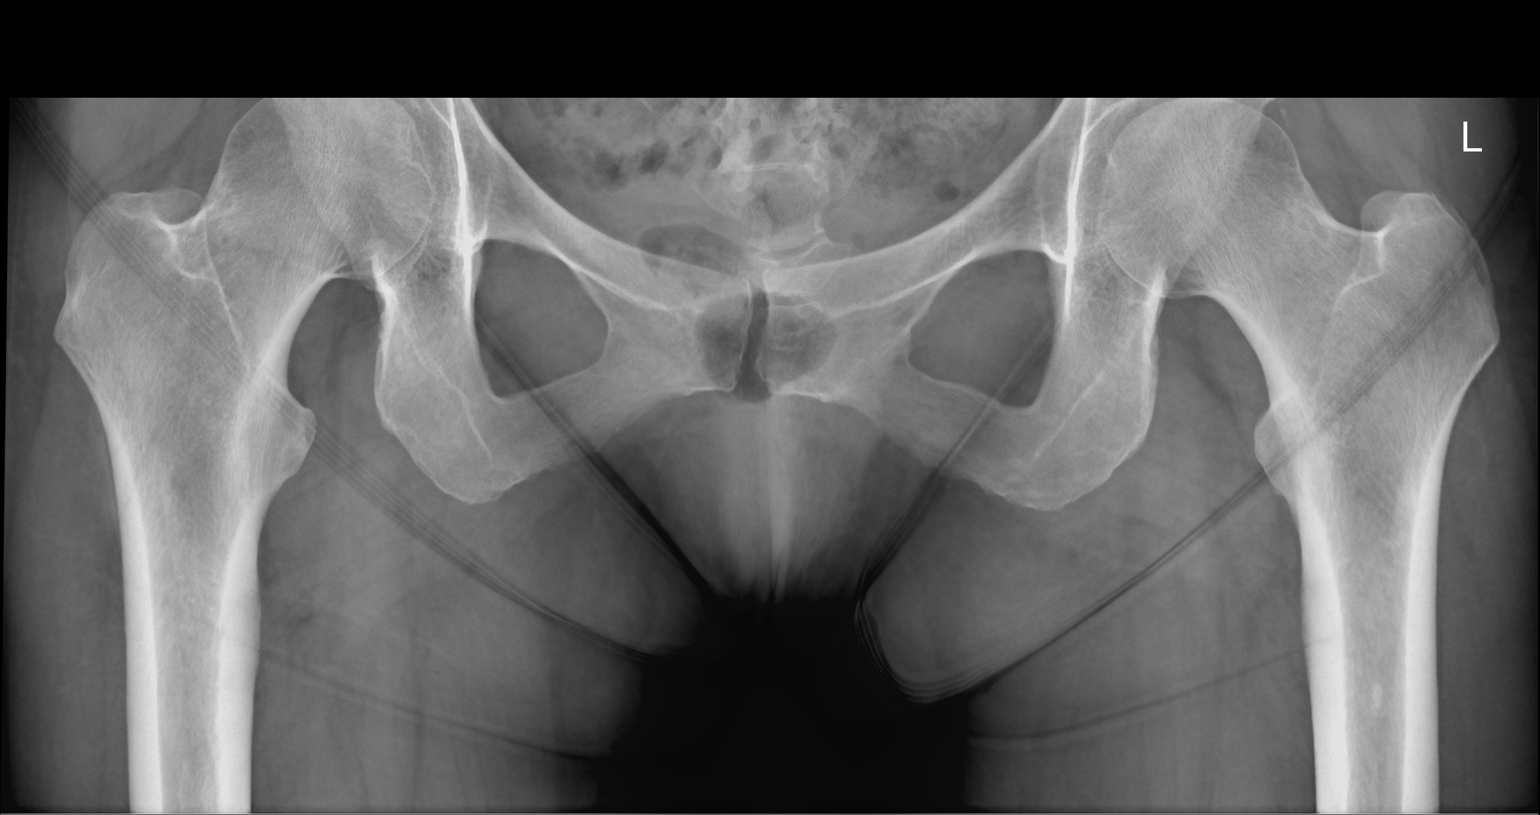

[2 of 2 positions shown; findings below may reference images not displayed]

EXAM

RADIOLOGICAL EXAMINATION, PELVIS; 1 OR 2 VIEWS CPT 20628

INDICATION

Pain, anal numbness. Patient complains of buttocks pain and anal pain/numbness. Pt states she fell
3 nights ago. CF

TECHNIQUE

1 view of the pelvis was acquired.

COMPARISONS

There are no previous examinations available for comparison at the time of dictation.

FINDINGS

Single AP view of the pelvis shows no displaced fractures or dislocations. Hip joint spaces appear
preserved without significant degenerative change. Lower lumbar spine and sacroiliac joints appear
unremarkable. No soft tissue abnormalities.

IMPRESSION

There are no radiographic abnormalities of the pelvis.

Tech Notes:

Pt c/o buttocks pain and anal pain/numbness. Pt states fall x 3 nights ago. CF

## 2021-11-09 IMAGING — MR SPLUMBWO
10 of 13 series · 29 of 48 positions shown · non-contrast
Comparison: none

[Series 5: T2 · sagittal · 4.0mm · 0.73mm/px · 2 of 17 slices shown (1 of 5)]
[im 1/17]
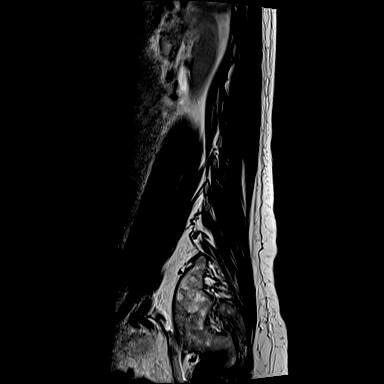
[im 17/17]
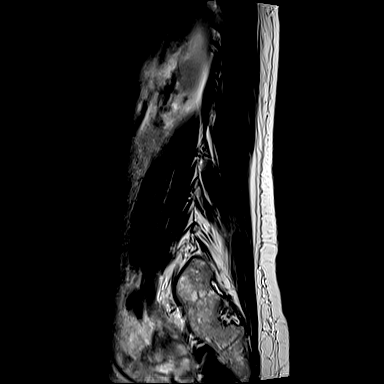

[Series 6: T1 · sagittal · 4.0mm · 0.81mm/px · 2 of 17 slices shown (1 of 4)]
[im 1/17]
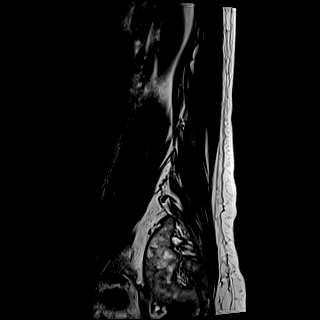
[im 17/17]
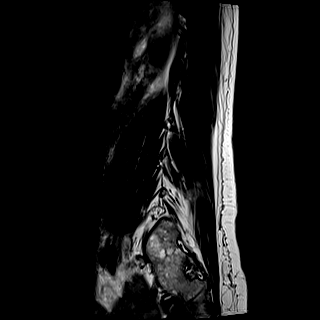

[Series 8: T2 · axial · 4.5mm · 0.81mm/px · z∈[-48,+103]mm · 4 of 29 slices shown (2 of 5)]
[im 1/29]
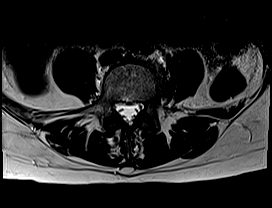
[im 10/29]
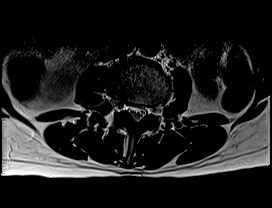
[im 19/29]
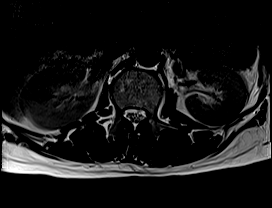
[im 29/29]
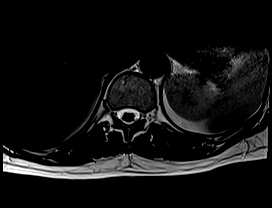

[Series 9: T2 · axial · 4.5mm · 0.81mm/px · 1 of 6 slices shown (3 of 5)]
[im 1/6]
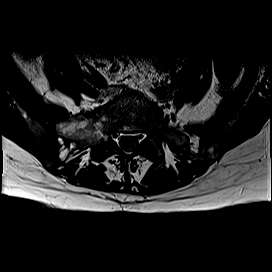

[Series 10: T2 · axial · 4.5mm · 0.81mm/px · z∈[-113,+103]mm · 5 of 35 slices shown (4 of 5)]
[im 1/35]
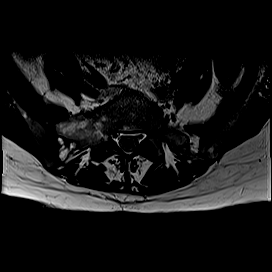
[im 9/35]
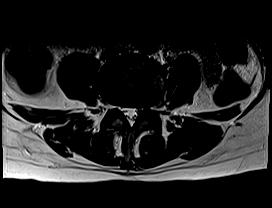
[im 18/35]
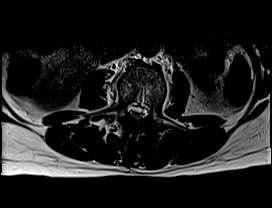
[im 26/35]
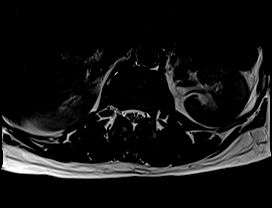
[im 35/35]
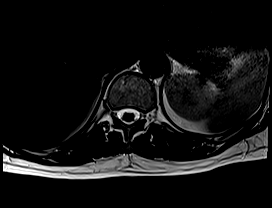

[Series 11: T1 · axial · 4.5mm · 0.43mm/px · z∈[-48,+103]mm · 4 of 29 slices shown (2 of 4)]
[im 1/29]
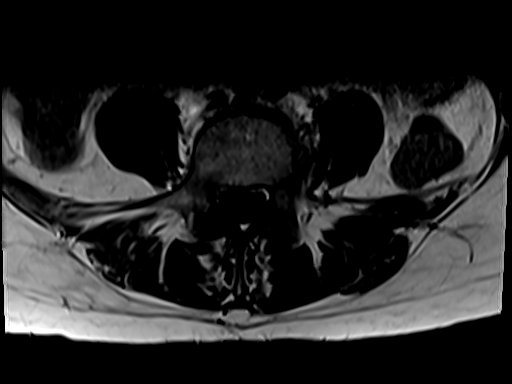
[im 10/29]
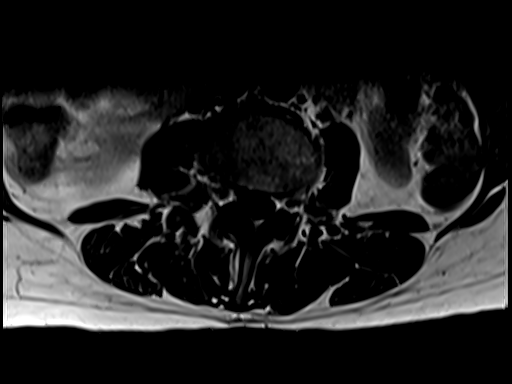
[im 19/29]
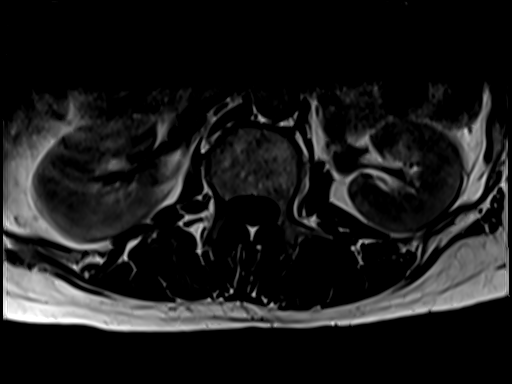
[im 29/29]
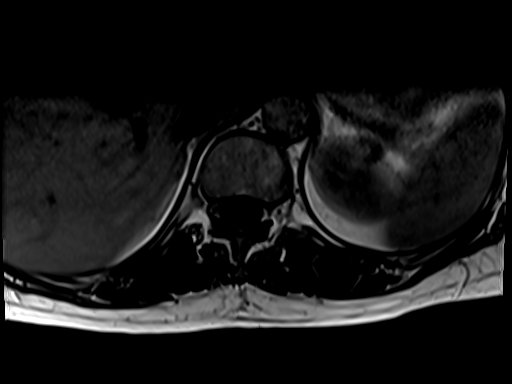

[Series 12: T1 · axial · 4.5mm · 0.43mm/px · 1 of 6 slices shown (3 of 4)]
[im 1/6]
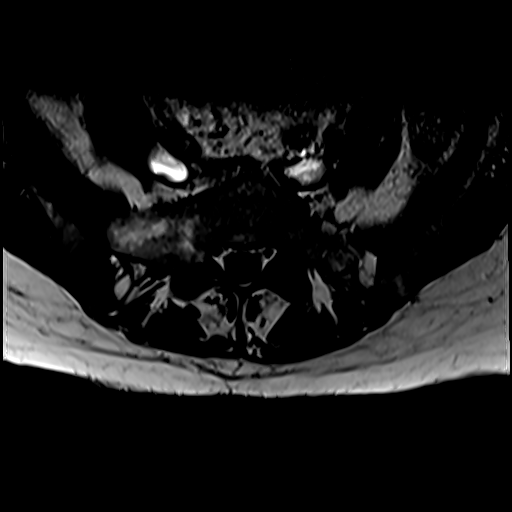

[Series 13: T1 · axial · 4.5mm · 0.43mm/px · z∈[-114,+103]mm · 5 of 35 slices shown (4 of 4)]
[im 1/35]
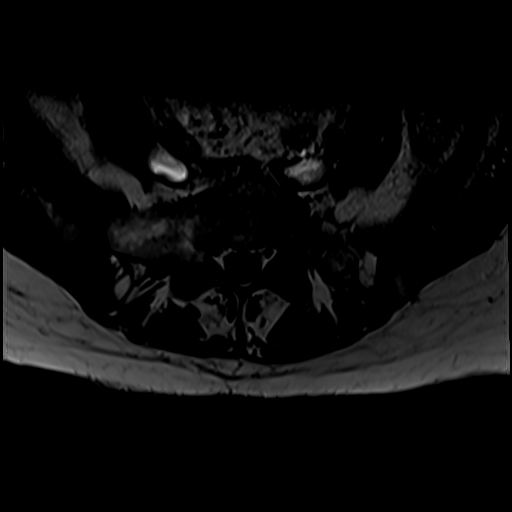
[im 9/35]
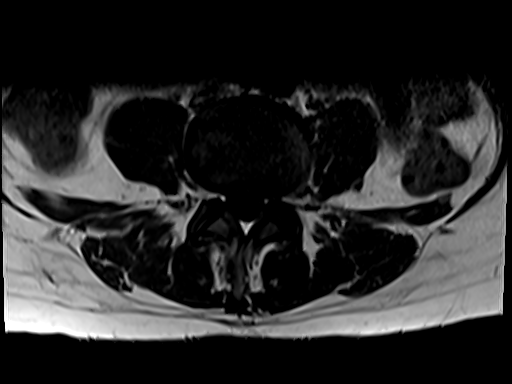
[im 18/35]
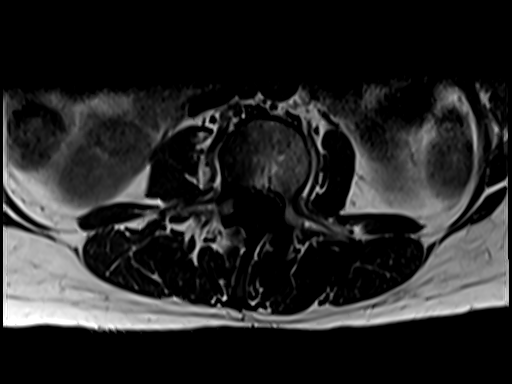
[im 26/35]
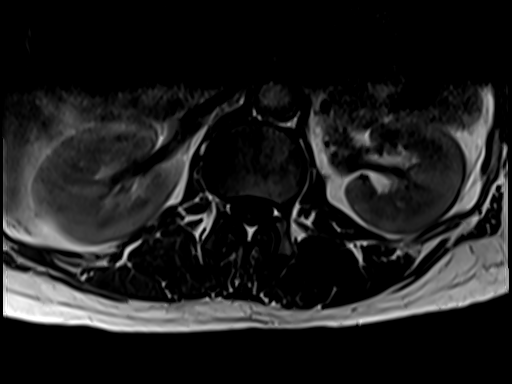
[im 35/35]
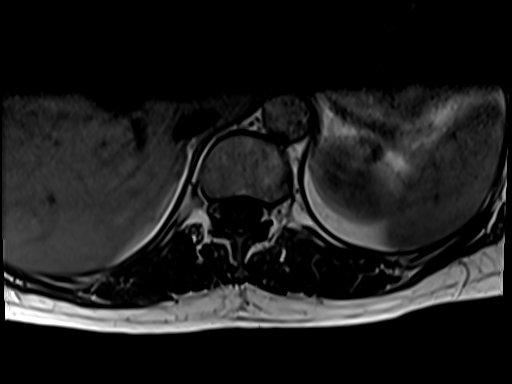

[Series 14: T2 · axial · 4.5mm · 0.74mm/px · z∈[-189,-86]mm · 3 of 22 slices shown (5 of 5)]
[im 1/22]
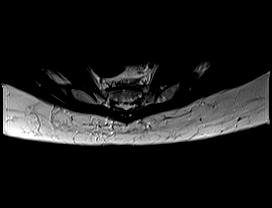
[im 11/22]
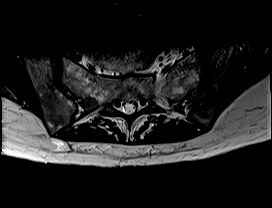
[im 22/22]
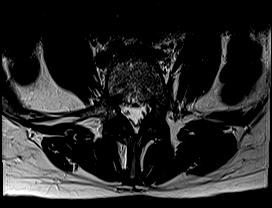

[Series 15: STIR · sagittal · 4.0mm · 0.55mm/px · 2 of 17 slices shown]
[im 1/17]
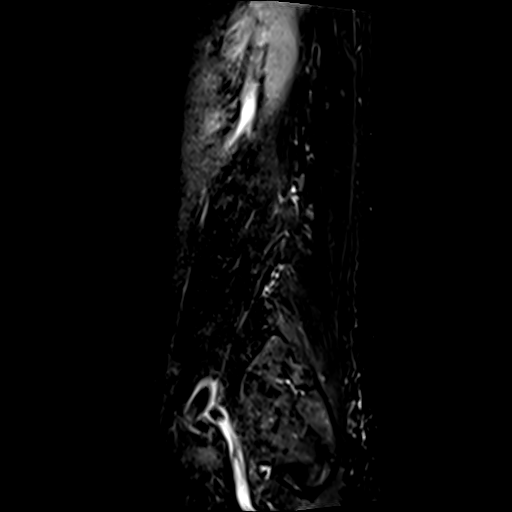
[im 17/17]
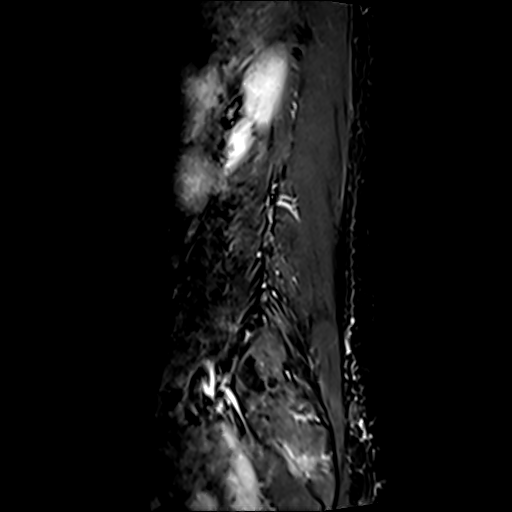

[29 of 48 positions shown; findings below may reference images not displayed]

DIAGNOSTIC STUDIES

EXAM

MRI lumbar spine without contrast.

INDICATION

numbness, tingling after a fall
fall down stairs, now pain to left low back with shooting pain in to groin, numbness in groin and
sphincter region. rg

TECHNIQUE

Sagittal axial images were obtained with variable T1 and T2 weighting.

COMPARISONS

None available

FINDINGS

There are no plain films. Patient is assumed to have 5 lumbar type bodies.

Conus medullaris is normal in signal intensity and location. Intervertebral discs at T12-L1 and L1-2
are normal.

L2-3: There is edema of the right lateral aspect of the adjacent vertebral endplates and associated
spurring which is felt to be degenerative rather than posttraumatic. Some minimal compression
deformity could also account for this appearance. There is a annular bulge of the disc with
associated facet hypertrophy resulting in mild bilateral neural foraminal stenosis.

L3-4: Slight disc bulging and facet hypertrophy is seen resulting in minimal left neural foraminal
stenosis.

L4-5: Disc bulging and facet hypertrophy is seen greater to the left than right resulting in mild
left neural foraminal stenosis.

L5-S1: The disc is normal at this level bilateral facet hypertrophy is seen without significant
central canal or neural foraminal stenosis.

IMPRESSION

Preferential disc space narrowing on the right at L2-3 and associated spurring of the vertebral
endplates. Edema can be seen within the vertebral endplates which likely is degenerative compression
the could appear similar.

Disc bulging and facet hypertrophy at multiple levels as described resulting in varying degrees of
central canal and neural foraminal stenosis.

Tech Notes:

fall down stairs, now pain to left low back with shooting pain in to groin, numbness in groin and
sphincter region. rg

## 2021-11-13 IMAGING — MR PELWO
6 of 12 series · 26 of 48 positions shown · non-contrast
Comparison: none

[Series 2: T1 · oblique · 3.0mm · 0.86mm/px · 4 of 18 slices shown (1 of 5)]
[im 1/18]
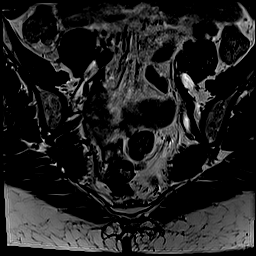
[im 6/18]
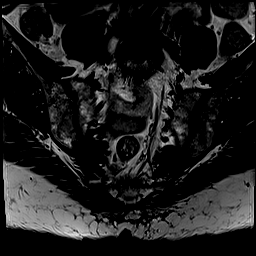
[im 12/18]
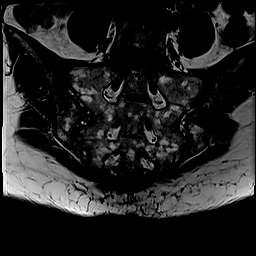
[im 18/18]
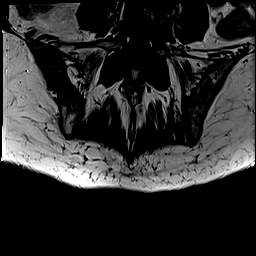

[Series 3: T1 · sagittal · 4.0mm · 0.39mm/px · 6 of 30 slices shown (2 of 5)]
[im 1/30]
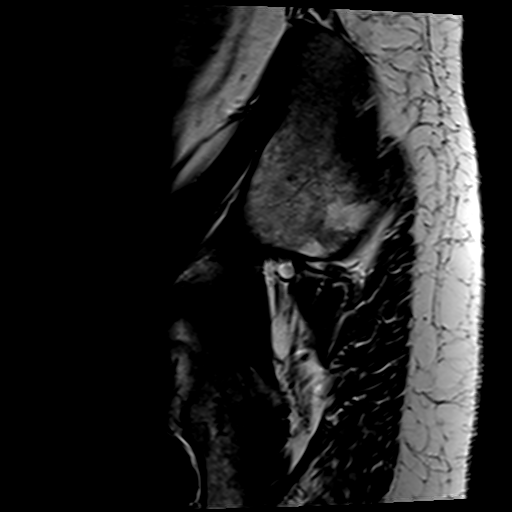
[im 6/30]
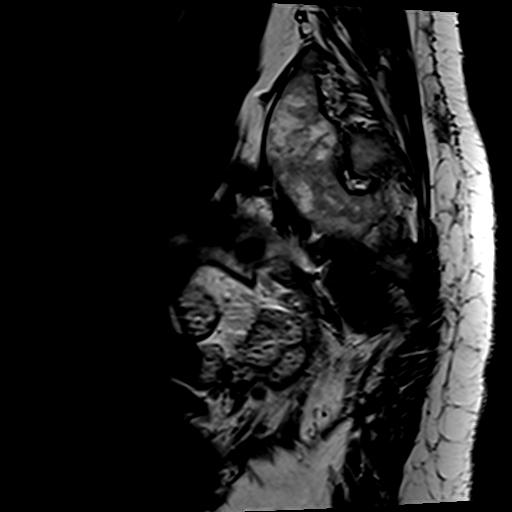
[im 12/30]
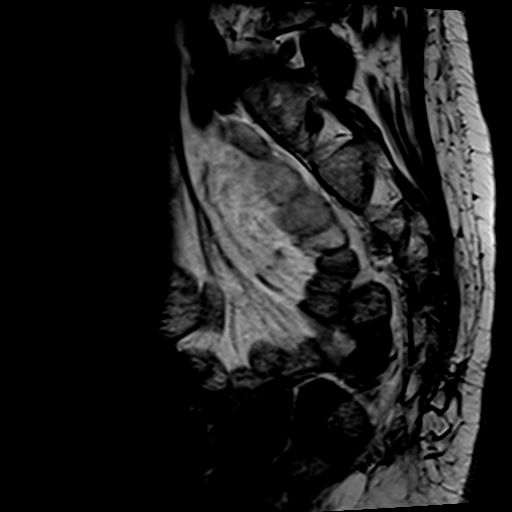
[im 18/30]
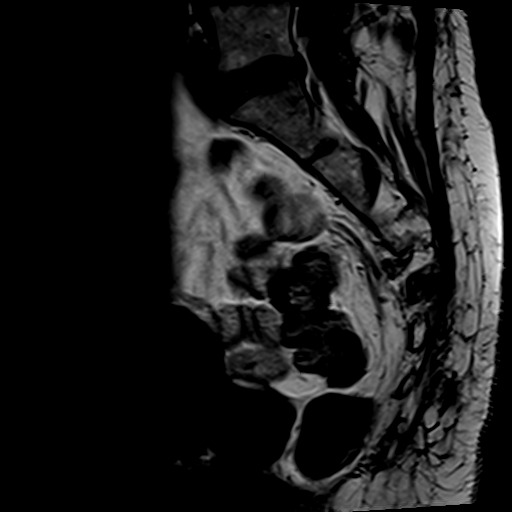
[im 24/30]
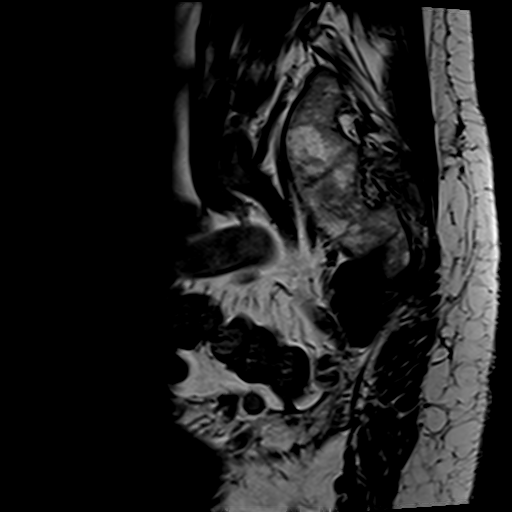
[im 30/30]
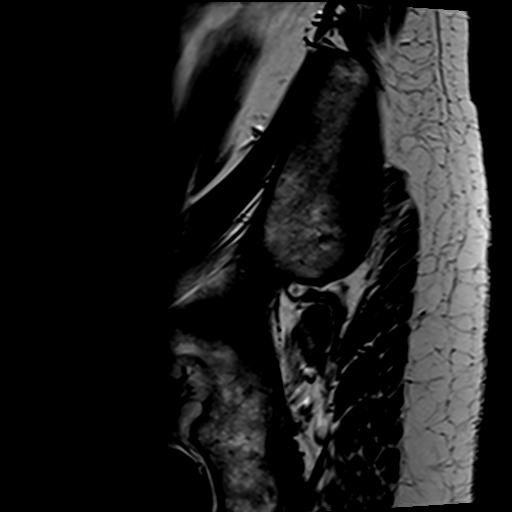

[Series 4: T1 · axial · 3.0mm · 0.35mm/px · z∈[-17,+80]mm · 6 of 32 slices shown (3 of 5)]
[im 1/32]
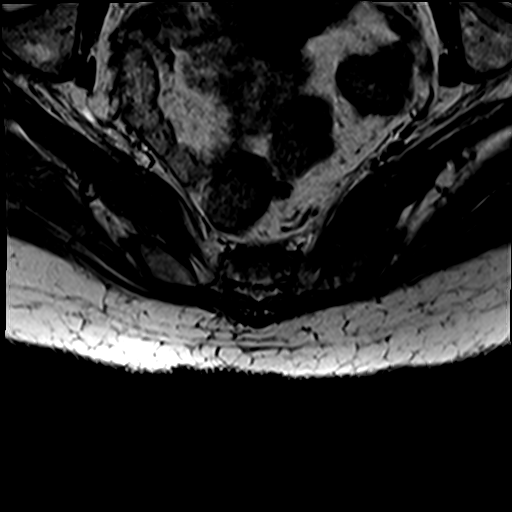
[im 7/32]
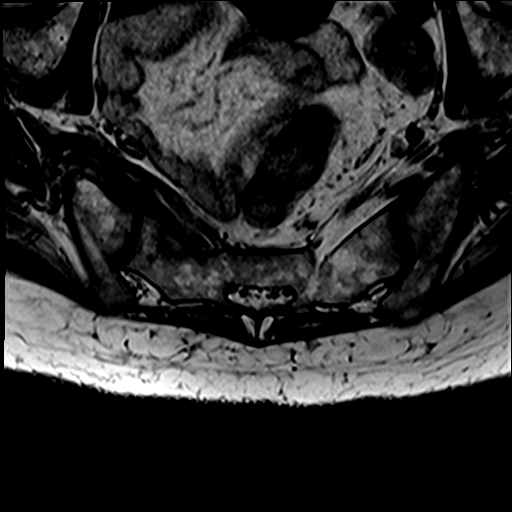
[im 13/32]
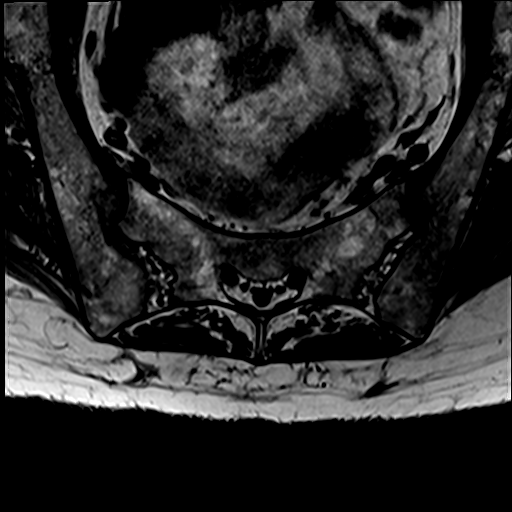
[im 19/32]
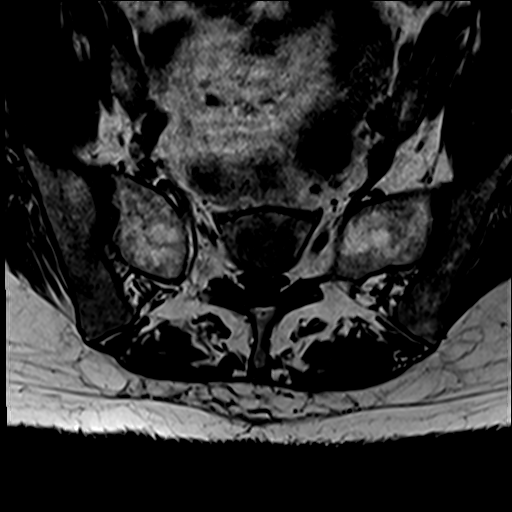
[im 25/32]
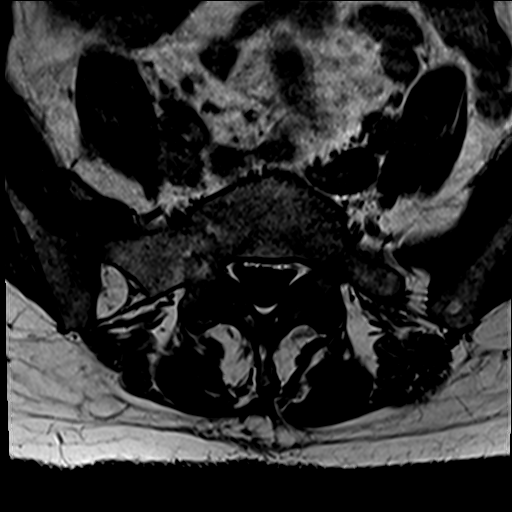
[im 32/32]
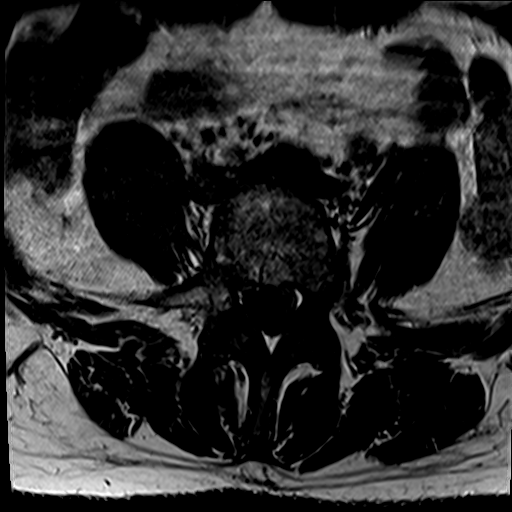

[Series 5: T1 · axial · 3.0mm · 0.31mm/px · z∈[-59,+31]mm · 5 of 26 slices shown (4 of 5)]
[im 1/26]
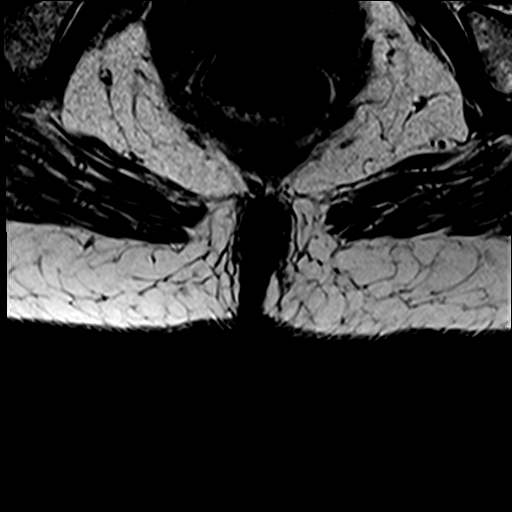
[im 7/26]
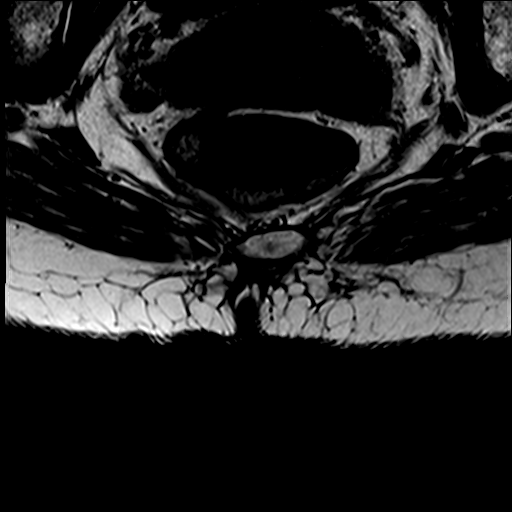
[im 13/26]
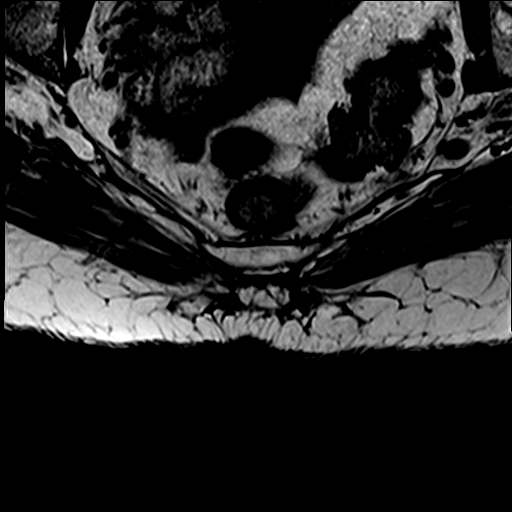
[im 19/26]
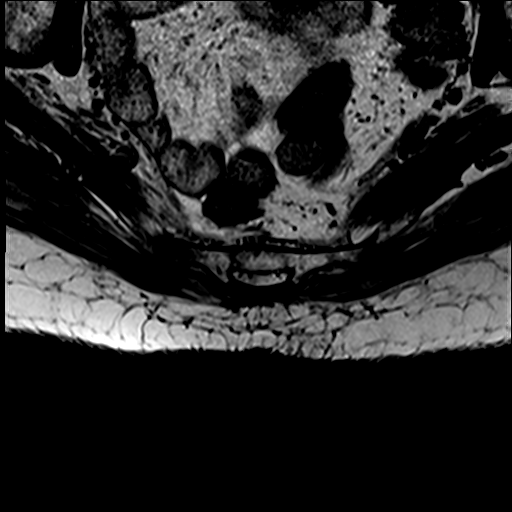
[im 26/26]
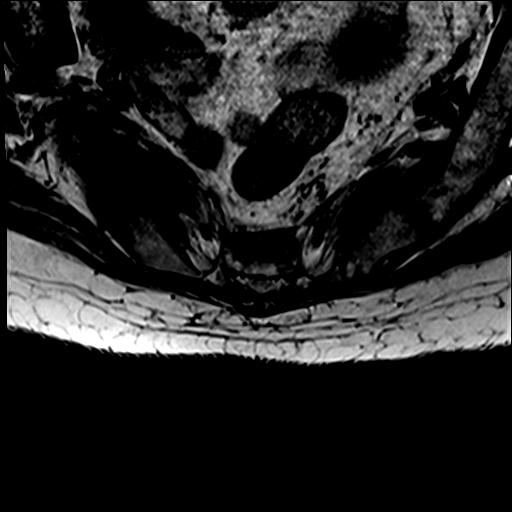

[Series 6: STIR · axial · 4.0mm · 0.70mm/px · z∈[-19,+2]mm · 2 of 18 slices shown]
[im 1/18]
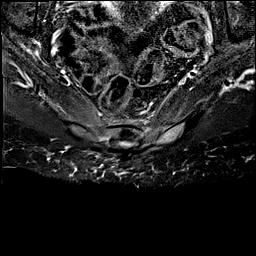
[im 6/18]
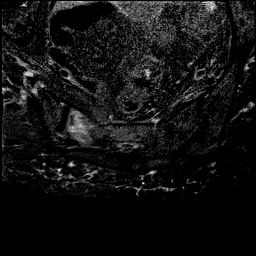

[Series 9: T1 · coronal · 3.0mm · 0.62mm/px · 3 of 15 slices shown (5 of 5)]
[im 1/15]
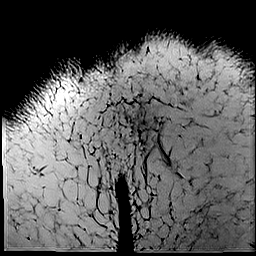
[im 8/15]
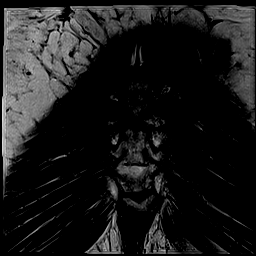
[im 15/15]
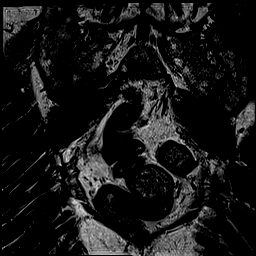

[26 of 48 positions shown; findings below may reference images not displayed]

DIAGNOSTIC STUDIES

EXAM

MRI of the sacrum without contrast.

INDICATION

concern sacral fx
FALL DOWN STAIRS, PAIN TO SACRUM/COCCYX AREA. RG

TECHNIQUE

Sagittal axial and coronal images were obtained with variable T1 and T2 weighting.

COMPARISONS

None available

FINDINGS

There is a fracture through the S4 sacral segment with slight anterior offset of the distal aspect
of S4 in relation of the proximal aspect by 5 millimeters best seen on sagittal image 15 series 3.
Marrow edema is noted within the right sacral ala 12 series 6 consistent bone contusion and possible
tiny insufficiency type fracture. The remaining visualized osseous structures are within normal
limits.

IMPRESSION

S4 sacral fracture. Edema in right sacral ala inferiorly is noted may indicate insufficiency
fracture and/or bone contusion.

Tech Notes:

FALL DOWN STAIRS, PAIN TO SACRUM/COCCYX AREA. RG

## 2022-03-08 ENCOUNTER — Encounter: Admit: 2022-03-08 | Discharge: 2022-03-08 | Payer: Commercial Managed Care - PPO

## 2022-03-08 NOTE — Telephone Encounter
03/08/2022 Records request faxed per workqueue task below.  sdc    Please request additional records from PCP, Dr. Alex Gardener / Ph: [phone] Fax: [phone].  No previous cardiac care.

## 2022-09-24 ENCOUNTER — Encounter: Admit: 2022-09-24 | Discharge: 2022-09-24 | Payer: Commercial Managed Care - PPO

## 2022-09-24 NOTE — Telephone Encounter
09/24/2022 Records saved to the consolidated folder from the office of Corky Crafts on 02/26/2022 have been deleted. Patient cancelled appointment on 04/10/22. Records have been in the consolidated folder for more than 90 days. Patient has not scheduled or rescheduled an appointment since records have been saved. EAK
